# Patient Record
Sex: Female | Born: 1955
Health system: Southern US, Community
[De-identification: ages and names within clinical notes are randomized; demographics above are authoritative.]

## PROBLEM LIST (undated history)

## (undated) DIAGNOSIS — M1990 Unspecified osteoarthritis, unspecified site: Secondary | ICD-10-CM

## (undated) DIAGNOSIS — E785 Hyperlipidemia, unspecified: Secondary | ICD-10-CM

## (undated) DIAGNOSIS — T7840XA Allergy, unspecified, initial encounter: Secondary | ICD-10-CM

## (undated) DIAGNOSIS — I10 Essential (primary) hypertension: Secondary | ICD-10-CM

## (undated) HISTORY — PX: TONSILLECTOMY: SUR1361

## (undated) HISTORY — PX: SPINE SURGERY: SHX786

## (undated) HISTORY — DX: Unspecified osteoarthritis, unspecified site: M19.90

## (undated) HISTORY — DX: Hyperlipidemia, unspecified: E78.5

## (undated) HISTORY — DX: Essential (primary) hypertension: I10

## (undated) HISTORY — PX: HERNIA REPAIR: SHX51

## (undated) HISTORY — DX: Allergy, unspecified, initial encounter: T78.40XA

---
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
## 1981-11-19 HISTORY — PX: NASAL SEPTUM SURGERY: SHX37

## 2000-11-19 HISTORY — PX: NECK SURGERY: SHX720

## 2005-06-11 ENCOUNTER — Ambulatory Visit: Payer: Self-pay | Admitting: Unknown Physician Specialty

## 2006-04-02 ENCOUNTER — Ambulatory Visit: Payer: Self-pay | Admitting: Family Medicine

## 2006-07-01 ENCOUNTER — Ambulatory Visit: Payer: Self-pay | Admitting: Unknown Physician Specialty

## 2006-07-09 ENCOUNTER — Ambulatory Visit: Payer: Self-pay | Admitting: Unknown Physician Specialty

## 2007-03-06 ENCOUNTER — Ambulatory Visit: Payer: Self-pay | Admitting: Unknown Physician Specialty

## 2007-11-20 HISTORY — PX: COLONOSCOPY: SHX174

## 2008-06-17 ENCOUNTER — Ambulatory Visit: Payer: Self-pay | Admitting: Family Medicine

## 2008-07-13 ENCOUNTER — Ambulatory Visit: Payer: Self-pay | Admitting: General Surgery

## 2008-07-13 LAB — HM COLONOSCOPY: HM Colonoscopy: NORMAL

## 2009-04-22 ENCOUNTER — Ambulatory Visit: Payer: Self-pay | Admitting: Family Medicine

## 2009-10-04 ENCOUNTER — Ambulatory Visit: Payer: Self-pay | Admitting: Family Medicine

## 2010-12-07 ENCOUNTER — Ambulatory Visit: Payer: Self-pay | Admitting: Family Medicine

## 2011-04-25 ENCOUNTER — Encounter: Payer: Self-pay | Admitting: Rheumatology

## 2011-05-20 ENCOUNTER — Encounter: Payer: Self-pay | Admitting: Rheumatology

## 2011-06-20 ENCOUNTER — Encounter: Payer: Self-pay | Admitting: Rheumatology

## 2012-01-31 ENCOUNTER — Ambulatory Visit: Payer: Self-pay | Admitting: Family Medicine

## 2014-04-30 ENCOUNTER — Ambulatory Visit: Payer: Self-pay | Admitting: Family Medicine

## 2015-01-12 LAB — HM PAP SMEAR: HM Pap smear: NEGATIVE

## 2015-01-12 LAB — LIPID PANEL
Cholesterol: 221 mg/dL — AB (ref 0–200)
HDL: 56 mg/dL (ref 35–70)
LDL Cholesterol: 124 mg/dL
Triglycerides: 226 mg/dL — AB (ref 40–160)

## 2015-01-12 LAB — BASIC METABOLIC PANEL
BUN: 12 mg/dL (ref 4–21)
Creatinine: 0.8 mg/dL (ref 0.5–1.1)
Glucose: 112 mg/dL
Potassium: 4.3 mmol/L (ref 3.4–5.3)
Sodium: 140 mmol/L (ref 137–147)

## 2015-01-12 LAB — CBC AND DIFFERENTIAL
HCT: 42 % (ref 36–46)
Hemoglobin: 14.4 g/dL (ref 12.0–16.0)
Platelets: 243 10*3/uL (ref 150–399)
WBC: 6.5 10^3/mL

## 2015-01-12 LAB — TSH: TSH: 2.77 u[IU]/mL (ref 0.41–5.90)

## 2015-01-12 LAB — HEPATIC FUNCTION PANEL
ALT: 13 U/L (ref 7–35)
AST: 16 U/L (ref 13–35)

## 2015-05-17 ENCOUNTER — Other Ambulatory Visit: Payer: Self-pay | Admitting: Family Medicine

## 2015-05-17 DIAGNOSIS — E7800 Pure hypercholesterolemia, unspecified: Secondary | ICD-10-CM

## 2015-09-07 ENCOUNTER — Other Ambulatory Visit: Payer: Self-pay | Admitting: Family Medicine

## 2015-09-07 DIAGNOSIS — E7800 Pure hypercholesterolemia, unspecified: Secondary | ICD-10-CM

## 2015-09-10 ENCOUNTER — Ambulatory Visit (INDEPENDENT_AMBULATORY_CARE_PROVIDER_SITE_OTHER): Payer: BLUE CROSS/BLUE SHIELD

## 2015-09-10 DIAGNOSIS — Z23 Encounter for immunization: Secondary | ICD-10-CM | POA: Diagnosis not present

## 2015-10-01 ENCOUNTER — Other Ambulatory Visit: Payer: Self-pay | Admitting: Family Medicine

## 2015-10-01 DIAGNOSIS — J3089 Other allergic rhinitis: Secondary | ICD-10-CM

## 2015-10-03 DIAGNOSIS — J309 Allergic rhinitis, unspecified: Secondary | ICD-10-CM | POA: Insufficient documentation

## 2015-10-03 NOTE — Telephone Encounter (Signed)
Last ov 01/12/15

## 2015-12-19 ENCOUNTER — Other Ambulatory Visit: Payer: Self-pay | Admitting: Family Medicine

## 2015-12-19 DIAGNOSIS — E7800 Pure hypercholesterolemia, unspecified: Secondary | ICD-10-CM

## 2015-12-19 NOTE — Telephone Encounter (Signed)
Last OV 12/2014  Thanks,   -  

## 2016-01-20 DIAGNOSIS — D3500 Benign neoplasm of unspecified adrenal gland: Secondary | ICD-10-CM | POA: Insufficient documentation

## 2016-01-20 DIAGNOSIS — J309 Allergic rhinitis, unspecified: Secondary | ICD-10-CM | POA: Insufficient documentation

## 2016-01-20 DIAGNOSIS — Z87891 Personal history of nicotine dependence: Secondary | ICD-10-CM | POA: Insufficient documentation

## 2016-01-20 DIAGNOSIS — IMO0002 Reserved for concepts with insufficient information to code with codable children: Secondary | ICD-10-CM | POA: Insufficient documentation

## 2016-01-20 DIAGNOSIS — F17200 Nicotine dependence, unspecified, uncomplicated: Secondary | ICD-10-CM | POA: Insufficient documentation

## 2016-01-20 DIAGNOSIS — E038 Other specified hypothyroidism: Secondary | ICD-10-CM | POA: Insufficient documentation

## 2016-01-20 DIAGNOSIS — M1990 Unspecified osteoarthritis, unspecified site: Secondary | ICD-10-CM | POA: Insufficient documentation

## 2016-01-20 DIAGNOSIS — E669 Obesity, unspecified: Secondary | ICD-10-CM | POA: Insufficient documentation

## 2016-01-20 DIAGNOSIS — E039 Hypothyroidism, unspecified: Secondary | ICD-10-CM | POA: Insufficient documentation

## 2016-01-20 DIAGNOSIS — F4320 Adjustment disorder, unspecified: Secondary | ICD-10-CM | POA: Insufficient documentation

## 2016-01-23 ENCOUNTER — Encounter: Payer: Self-pay | Admitting: Family Medicine

## 2016-01-23 ENCOUNTER — Ambulatory Visit (INDEPENDENT_AMBULATORY_CARE_PROVIDER_SITE_OTHER): Payer: BLUE CROSS/BLUE SHIELD | Admitting: Family Medicine

## 2016-01-23 VITALS — BP 130/82 | HR 72 | Temp 97.6°F | Resp 16 | Ht 66.0 in | Wt 203.0 lb

## 2016-01-23 DIAGNOSIS — Z0000 Encounter for general adult medical examination without abnormal findings: Secondary | ICD-10-CM

## 2016-01-23 DIAGNOSIS — E038 Other specified hypothyroidism: Secondary | ICD-10-CM

## 2016-01-23 DIAGNOSIS — Z1239 Encounter for other screening for malignant neoplasm of breast: Secondary | ICD-10-CM

## 2016-01-23 DIAGNOSIS — E039 Hypothyroidism, unspecified: Secondary | ICD-10-CM

## 2016-01-23 DIAGNOSIS — Z1211 Encounter for screening for malignant neoplasm of colon: Secondary | ICD-10-CM

## 2016-01-23 DIAGNOSIS — E7800 Pure hypercholesterolemia, unspecified: Secondary | ICD-10-CM | POA: Diagnosis not present

## 2016-01-23 DIAGNOSIS — F32A Depression, unspecified: Secondary | ICD-10-CM | POA: Insufficient documentation

## 2016-01-23 DIAGNOSIS — F329 Major depressive disorder, single episode, unspecified: Secondary | ICD-10-CM

## 2016-01-23 DIAGNOSIS — R7309 Other abnormal glucose: Secondary | ICD-10-CM | POA: Insufficient documentation

## 2016-01-23 LAB — POCT URINALYSIS DIPSTICK
Bilirubin, UA: NEGATIVE
Blood, UA: NEGATIVE
Glucose, UA: NEGATIVE
Ketones, UA: NEGATIVE
Leukocytes, UA: NEGATIVE
Nitrite, UA: NEGATIVE
Protein, UA: NEGATIVE
Spec Grav, UA: 1.01
Urobilinogen, UA: 0.2
pH, UA: 6.5

## 2016-01-23 LAB — IFOBT (OCCULT BLOOD): IFOBT: NEGATIVE

## 2016-01-23 MED ORDER — CITALOPRAM HYDROBROMIDE 20 MG PO TABS: 20.0000 mg | ORAL_TABLET | Freq: Every day | ORAL | Status: DC

## 2016-01-23 NOTE — Progress Notes (Signed)
Patient ID: [name], female   DOB: 1955-12-13, 60 y.o.   MRN: [id number]       Patient: Sharon Parker, Female    DOB: 02/19/1956, 60 y.o.   MRN: [id number] Visit Date: 01/23/2016  Today's Provider: Margarita Rana, MD   Chief Complaint  Patient presents with  . Annual Exam   Subjective:    Annual physical exam Sharon Parker is a 60 y.o. female who presents today for health maintenance and complete physical. She feels fairly well. She reports exercising 3 times a week. She reports she is sleeping fairly well, 8 hours of sleep. 01/12/15 CPE 01/12/15 Pap-neg; HPV-neg 04/30/14 Mammogram BI-RADS 1 07/13/08 Colonoscopy-normal, Dr. Bary Castilla  Patient also with increased mood and depression issues. A lot of stress at home. Taking care of her mother and is loosing patience.   Has been on a medication previously with good response.     Lab Results  Component Value Date   WBC 6.5 01/12/2015   HGB 14.4 01/12/2015   HCT 42 01/12/2015   PLT 243 01/12/2015   CHOL 221* 01/12/2015   TRIG 226* 01/12/2015   HDL 56 01/12/2015   LDLCALC 124 01/12/2015   ALT 13 01/12/2015   AST 16 01/12/2015   NA 140 01/12/2015   K 4.3 01/12/2015   CREATININE 0.8 01/12/2015   BUN 12 01/12/2015   TSH 2.77 01/12/2015   -----------------------------------------------------------------   Review of Systems  Constitutional: Negative.   HENT: Negative.   Eyes: Negative.   Respiratory: Negative.   Cardiovascular: Negative.   Gastrointestinal: Negative.   Endocrine: Negative.   Genitourinary: Negative.   Musculoskeletal: Negative.   Skin: Negative.   Allergic/Immunologic: Positive for environmental allergies.  Neurological: Negative.   Hematological: Negative.   Psychiatric/Behavioral: Positive for sleep disturbance, decreased concentration and agitation. The patient is nervous/anxious.     Social History      She  reports that she quit smoking about 4 years ago. Her smoking use included  Cigarettes. She smoked 1.00 pack per day. She has never used smokeless tobacco. She reports that she does not drink alcohol or use illicit drugs.       Social History   Social History  . Marital Status: Married    Spouse Name: N/A  . Number of Children: N/A  . Years of Education: N/A   Social History Main Topics  . Smoking status: Former Smoker -- 1.00 packs/day    Types: Cigarettes    Quit date: 11/19/2011  . Smokeless tobacco: Never Used  . Alcohol Use: No  . Drug Use: No  . Sexual Activity: Not Asked   Other Topics Concern  . None   Social History Narrative    Past Medical History  Diagnosis Date  . Hyperlipidemia   . Allergy      Patient Active Problem List   Diagnosis Date Noted  . Abnormal blood sugar 01/23/2016  . Depression 01/23/2016  . Adaptation reaction 01/20/2016  . Adrenal adenoma 01/20/2016  . Allergic rhinitis 01/20/2016  . Adult BMI 30+ 01/20/2016  . Adiposity 01/20/2016  . Arthritis, degenerative 01/20/2016  . Subclinical hypothyroidism 01/20/2016  . Compulsive tobacco user syndrome 01/20/2016  . Rhinitis, allergic 10/03/2015  . Hypercholesteremia 05/17/2015    Past Surgical History  Procedure Laterality Date  . Neck surgery  2002    disc in neck replaced C5-C6  . Cesarean section  1994  . Nasal septum surgery  1983  . Tonsillectomy and adenoidectomy  1970  .  Hernia repair      Family History        Family Status  Relation Status Death Age  . Mother Alive   . Father Deceased     lung cancer Nicotine dependence  . Sister Alive   . Brother Alive   . Sister Alive   . Sister Alive   . Sister Alive   . Brother Alive         Her family history includes Cancer in her father; Diabetes in her sister; Healthy in her brother, brother, sister, sister, and sister; Heart disease in her mother; Obesity in her sister; Stroke in her mother.    No Known Allergies  Previous Medications   B COMPLEX-BIOTIN-FA (SUPER B-COMPLEX) CAPS    Take 1  capsule by mouth daily.   FLUTICASONE (FLONASE) 50 MCG/ACT NASAL SPRAY    instill 2 sprays into each nostril once daily   MULTIPLE VITAMIN PO    Take 1 capsule by mouth daily.   OMEGA-3 FATTY ACIDS (FISH OIL PEARLS) 300 MG CAPS    Take 2 capsules by mouth daily.   SIMVASTATIN (ZOCOR) 20 MG TABLET    Take 1 tablet (20 mg total) by mouth daily. Pt needs to schedule an office visit before anymore refills.   VITAMIN E PO    Take 1 capsule by mouth daily.    Patient Care Team: Margarita Rana, MD as PCP - General (Family Medicine)     Objective:   Vitals: BP 130/82 mmHg  Pulse 72  Temp(Src) 97.6 F (36.4 C) (Oral)  Resp 16  Ht 5\' 6"  (1.676 m)  Wt 203 lb (92.08 kg)  BMI 32.78 kg/m2   Physical Exam  Constitutional: She is oriented to person, place, and time. She appears well-developed and well-nourished.  HENT:  Head: Normocephalic and atraumatic.  Right Ear: Tympanic membrane, external ear and ear canal normal.  Left Ear: Tympanic membrane, external ear and ear canal normal.  Nose: Nose normal.  Mouth/Throat: Uvula is midline, oropharynx is clear and moist and mucous membranes are normal.  Eyes: Conjunctivae, EOM and lids are normal. Pupils are equal, round, and reactive to light.  Neck: Trachea normal and normal range of motion. Neck supple. Carotid bruit is not present. No thyroid mass and no thyromegaly present.  Cardiovascular: Normal rate, regular rhythm and normal heart sounds.   Pulmonary/Chest: Effort normal and breath sounds normal.  Abdominal: Soft. Normal appearance and bowel sounds are normal. There is no hepatosplenomegaly. There is no tenderness.  Genitourinary: Rectum normal. No breast swelling, tenderness or discharge.  Musculoskeletal: Normal range of motion.  Lymphadenopathy:    She has no cervical adenopathy.    She has no axillary adenopathy.  Neurological: She is alert and oriented to person, place, and time. She has normal strength. No cranial nerve deficit.    Skin: Skin is warm, dry and intact.  Psychiatric: She has a normal mood and affect. Her speech is normal and behavior is normal. Judgment and thought content normal. Cognition and memory are normal.     Depression Screen PHQ 2/9 Scores 01/23/2016  PHQ - 2 Score 3  PHQ- 9 Score 11      Assessment & Plan:     Routine Health Maintenance and Physical Exam  Exercise Activities and Dietary recommendations Goals    None      Immunization History  Administered Date(s) Administered  . Influenza,inj,Quad PF,36+ Mos 09/10/2015  . Td 08/24/2011  . Tdap 08/24/2011  . Zoster  08/24/2011        1. Annual physical exam Stable. Patient advised to continue eating healthy and exercise daily. - POCT urinalysis dipstick  2. Depression New problem. Worsening. Patient started on citalopram 20 mg as below. Patient to follow-up in 4 weeks. - citalopram (CELEXA) 20 MG tablet; Take 1 tablet (20 mg total) by mouth daily. Take half a tablet for 1 week then 1 tablet daily  Dispense: 30 tablet; Refill: 5  3. Colon cancer screening - IFOBT POC (occult bld, rslt in office)  4. Breast cancer screening - MM DIGITAL SCREENING BILATERAL; Future  5. Hypercholesteremia - CBC with Differential/Platelet - Lipid Panel With LDL/HDL Ratio  6. Subclinical hypothyroidism - Comprehensive metabolic panel - TSH  7. Abnormal blood sugar - Hemoglobin A1c   Patient seen and examined by Dr. Jerrell Belfast, and note scribed by Philbert Riser. Dimas, CMA.  I have reviewed the document for accuracy and completeness and I agree with above. Jerrell Belfast, MD      Margarita Rana, MD   --------------------------------------------------------------------

## 2016-01-24 ENCOUNTER — Telehealth: Payer: Self-pay

## 2016-01-24 LAB — CBC WITH DIFFERENTIAL/PLATELET
Basophils Absolute: 0.1 10*3/uL (ref 0.0–0.2)
Basos: 1 %
EOS (ABSOLUTE): 0.2 10*3/uL (ref 0.0–0.4)
Eos: 3 %
Hematocrit: 39.8 % (ref 34.0–46.6)
Hemoglobin: 13.6 g/dL (ref 11.1–15.9)
Immature Grans (Abs): 0 10*3/uL (ref 0.0–0.1)
Immature Granulocytes: 0 %
Lymphocytes Absolute: 3.2 10*3/uL — ABNORMAL HIGH (ref 0.7–3.1)
Lymphs: 41 %
MCH: 31.5 pg (ref 26.6–33.0)
MCHC: 34.2 g/dL (ref 31.5–35.7)
MCV: 92 fL (ref 79–97)
Monocytes Absolute: 0.6 10*3/uL (ref 0.1–0.9)
Monocytes: 8 %
Neutrophils Absolute: 3.8 10*3/uL (ref 1.4–7.0)
Neutrophils: 47 %
Platelets: 271 10*3/uL (ref 150–379)
RBC: 4.32 x10E6/uL (ref 3.77–5.28)
RDW: 13.1 % (ref 12.3–15.4)
WBC: 7.9 10*3/uL (ref 3.4–10.8)

## 2016-01-24 LAB — LIPID PANEL WITH LDL/HDL RATIO
Cholesterol, Total: 225 mg/dL — ABNORMAL HIGH (ref 100–199)
HDL: 54 mg/dL (ref >39)
LDL Calculated: 105 mg/dL — ABNORMAL HIGH (ref 0–99)
LDl/HDL Ratio: 1.9 ratio units (ref 0.0–3.2)
Triglycerides: 332 mg/dL — ABNORMAL HIGH (ref 0–149)
VLDL Cholesterol Cal: 66 mg/dL — ABNORMAL HIGH (ref 5–40)

## 2016-01-24 LAB — COMPREHENSIVE METABOLIC PANEL
ALT: 18 IU/L (ref 0–32)
AST: 18 IU/L (ref 0–40)
Albumin/Globulin Ratio: 1.9 (ref 1.1–2.5)
Albumin: 4.7 g/dL (ref 3.5–5.5)
Alkaline Phosphatase: 99 IU/L (ref 39–117)
BUN/Creatinine Ratio: 17 (ref 9–23)
BUN: 11 mg/dL (ref 6–24)
Bilirubin Total: 0.5 mg/dL (ref 0.0–1.2)
CO2: 24 mmol/L (ref 18–29)
Calcium: 9.4 mg/dL (ref 8.7–10.2)
Chloride: 100 mmol/L (ref 96–106)
Creatinine, Ser: 0.65 mg/dL (ref 0.57–1.00)
GFR calc Af Amer: 112 mL/min/{1.73_m2} (ref >59)
GFR calc non Af Amer: 97 mL/min/{1.73_m2} (ref >59)
Globulin, Total: 2.5 g/dL (ref 1.5–4.5)
Glucose: 95 mg/dL (ref 65–99)
Potassium: 4.5 mmol/L (ref 3.5–5.2)
Sodium: 142 mmol/L (ref 134–144)
Total Protein: 7.2 g/dL (ref 6.0–8.5)

## 2016-01-24 LAB — TSH: TSH: 3.18 u[IU]/mL (ref 0.450–4.500)

## 2016-01-24 LAB — HEMOGLOBIN A1C
Est. average glucose Bld gHb Est-mCnc: 117 mg/dL
Hgb A1c MFr Bld: 5.7 % — ABNORMAL HIGH (ref 4.8–5.6)

## 2016-01-24 NOTE — Telephone Encounter (Signed)
Pt advised as directed below.  She says she is going to work on diet and exercise before increasing her medicine.   Thanks,   -Mickel Baas

## 2016-01-24 NOTE — Telephone Encounter (Signed)
-----   Message from Margarita Rana, MD sent at 01/24/2016  7:16 AM EST ----- Labs stable Triglycerides are elevated. Recommend eat healthy and exercise and work on weight loss. Also,  Can increase statin if she would like. Thanks.

## 2016-02-21 ENCOUNTER — Ambulatory Visit (INDEPENDENT_AMBULATORY_CARE_PROVIDER_SITE_OTHER): Payer: BLUE CROSS/BLUE SHIELD | Admitting: Family Medicine

## 2016-02-21 ENCOUNTER — Encounter: Payer: Self-pay | Admitting: Family Medicine

## 2016-02-21 ENCOUNTER — Ambulatory Visit
Admission: RE | Admit: 2016-02-21 | Discharge: 2016-02-21 | Disposition: A | Discharge status: Home / Self Care | Payer: BLUE CROSS/BLUE SHIELD | Source: Ambulatory Visit | Attending: Family Medicine | Admitting: Family Medicine

## 2016-02-21 VITALS — BP 128/54 | HR 74 | Temp 98.4°F | Resp 16 | Wt 204.0 lb

## 2016-02-21 DIAGNOSIS — R937 Abnormal findings on diagnostic imaging of other parts of musculoskeletal system: Secondary | ICD-10-CM | POA: Diagnosis not present

## 2016-02-21 DIAGNOSIS — W19XXXA Unspecified fall, initial encounter: Secondary | ICD-10-CM | POA: Diagnosis not present

## 2016-02-21 DIAGNOSIS — M25532 Pain in left wrist: Secondary | ICD-10-CM | POA: Diagnosis present

## 2016-02-21 DIAGNOSIS — S6992XA Unspecified injury of left wrist, hand and finger(s), initial encounter: Secondary | ICD-10-CM

## 2016-02-21 MED ORDER — HYDROCODONE-ACETAMINOPHEN 5-325 MG PO TABS: ORAL_TABLET | ORAL | Status: DC

## 2016-02-21 NOTE — Progress Notes (Signed)
Subjective:     Patient ID: Sharon Parker, female   DOB: [birth year], 60 y.o.   MRN: [id number]  HPI  Chief Complaint  Patient presents with  . Fall    Patient comes in office today to address fall that happened on 02/17/16. Patient states that she had rolled out of bed when she fell and hit floor , landing on the left side of her body. Paitent reports when she was falling she tried placing her hand out to prevent it and in doing so has injured her left wrist. Patient reports decreased mobility of hand and wrist and swelling. Patient has been taking otc Aleve as needed.   States she has burning and numbness in her fingers as well. Has been elevating and using cold compresses.   Review of Systems     Objective:   Physical Exam  Constitutional: She appears well-developed and well-nourished. No distress (guarding her left hand).  Musculoskeletal:  Left hand warm with mild dorsal wrist/hand swelling. Most tender over the radial aspect of her wrist/thenar area       Assessment:    1. Left wrist injury, initial encounter: suspect fracture - DG Wrist Complete Left; Future - HYDROcodone-acetaminophen (NORCO/VICODIN) 5-325 MG tablet; One every 4-6 hours as needed for pain  Dispense: 28 tablet; Refill: 0    Plan:    Further f/u pending x-ray report.

## 2016-02-21 NOTE — Patient Instructions (Signed)
We will call you with the x-ray report 

## 2016-02-27 ENCOUNTER — Telehealth: Payer: Self-pay | Admitting: Family Medicine

## 2016-02-27 NOTE — Telephone Encounter (Signed)
Pt stated she was here for OV on 02/21/16(with Mikki Santee) and her left wrist is still hurting and wanted to move forward with the ortho referral. Please advise. Thanks TNP

## 2016-02-27 NOTE — Telephone Encounter (Signed)
Referral request

## 2016-02-28 ENCOUNTER — Other Ambulatory Visit: Payer: Self-pay | Admitting: Family Medicine

## 2016-02-28 ENCOUNTER — Telehealth: Payer: Self-pay | Admitting: Family Medicine

## 2016-02-28 DIAGNOSIS — S6992XA Unspecified injury of left wrist, hand and finger(s), initial encounter: Secondary | ICD-10-CM

## 2016-02-28 DIAGNOSIS — M25532 Pain in left wrist: Secondary | ICD-10-CM

## 2016-02-28 MED ORDER — HYDROCODONE-ACETAMINOPHEN 5-325 MG PO TABS: ORAL_TABLET | ORAL | Status: DC

## 2016-02-28 NOTE — Telephone Encounter (Signed)
done

## 2016-02-28 NOTE — Telephone Encounter (Signed)
Pt called, please call her back with the info regarding the referral.  She says her wrist and arm is hurting and would like to be seen asap.    Her call back is [phone].

## 2016-02-28 NOTE — Telephone Encounter (Signed)
Is there a way to expedite referral so patient can be seen sooner?KW

## 2016-02-28 NOTE — Telephone Encounter (Signed)
Pt is requesting refill on HYDROcodone-acetaminophen (NORCO/VICODIN) 5-325 MG tablet.until see can get in to see ortho on Friday 03/02/16.Please call when ready for pick up

## 2016-02-28 NOTE — Telephone Encounter (Signed)
See patient request regarding recent orthopedic referral

## 2016-02-28 NOTE — Telephone Encounter (Signed)
Referral in progress. 

## 2016-03-05 ENCOUNTER — Ambulatory Visit: Payer: BLUE CROSS/BLUE SHIELD | Admitting: Family Medicine

## 2016-03-06 ENCOUNTER — Ambulatory Visit
Admission: RE | Admit: 2016-03-06 | Discharge: 2016-03-06 | Disposition: A | Discharge status: Home / Self Care | Payer: BLUE CROSS/BLUE SHIELD | Source: Ambulatory Visit | Attending: Family Medicine | Admitting: Family Medicine

## 2016-03-06 DIAGNOSIS — Z1231 Encounter for screening mammogram for malignant neoplasm of breast: Secondary | ICD-10-CM | POA: Insufficient documentation

## 2016-03-06 DIAGNOSIS — Z1239 Encounter for other screening for malignant neoplasm of breast: Secondary | ICD-10-CM

## 2016-03-19 ENCOUNTER — Encounter: Payer: Self-pay | Admitting: Family Medicine

## 2016-03-19 ENCOUNTER — Ambulatory Visit (INDEPENDENT_AMBULATORY_CARE_PROVIDER_SITE_OTHER): Payer: BLUE CROSS/BLUE SHIELD | Admitting: Family Medicine

## 2016-03-19 VITALS — BP 138/60 | HR 76 | Temp 98.2°F | Resp 16 | Wt 204.0 lb

## 2016-03-19 DIAGNOSIS — F329 Major depressive disorder, single episode, unspecified: Secondary | ICD-10-CM

## 2016-03-19 DIAGNOSIS — F32A Depression, unspecified: Secondary | ICD-10-CM

## 2016-03-19 NOTE — Progress Notes (Signed)
Patient ID: Sharon Parker, female   DOB: 1956-01-22, 60 y.o.   MRN: [id number]         Patient: Sharon Parker Female    DOB: 08-24-1956   60 y.o.   MRN: [id number] Visit Date: 03/19/2016  Today's Provider: Margarita Rana, MD   Chief Complaint  Patient presents with  . Depression  . Anxiety   Subjective:    Depression        This is a chronic problem.  The problem has been gradually improving since onset.  Associated symptoms include decreased concentration and decreased interest.  Associated symptoms include no fatigue, no helplessness, no hopelessness, does not have insomnia, not irritable, no restlessness, no appetite change, no body aches, no myalgias, no headaches, no indigestion, not sad and no suicidal ideas.     The symptoms are aggravated by family issues.  Past treatments include SSRIs - Selective serotonin reuptake inhibitors.  Compliance with treatment is good.  Previous treatment provided moderate (Pt reports about a 50% improvement.) relief.  Past medical history includes anxiety.   Anxiety Presents for follow-up visit. The problem has been gradually improving. Symptoms include decreased concentration, depressed mood, excessive worry and nervous/anxious behavior. Patient reports no chest pain, confusion, dizziness, insomnia, irritability, malaise, nausea, obsessions, palpitations, panic, restlessness, shortness of breath or suicidal ideas. The quality of sleep is good.   Past treatments include SSRIs. The treatment provided moderate relief. Compliance with prior treatments has been good.       No Known Allergies Previous Medications   B COMPLEX-BIOTIN-FA (SUPER B-COMPLEX) CAPS    Take 1 capsule by mouth daily.   CITALOPRAM (CELEXA) 20 MG TABLET    Take 1 tablet (20 mg total) by mouth daily. Take half a tablet for 1 week then 1 tablet daily   FLUTICASONE (FLONASE) 50 MCG/ACT NASAL SPRAY    instill 2 sprays into each nostril once daily   MELOXICAM (MOBIC) 15 MG TABLET     Take 15 mg by mouth daily.   MULTIPLE VITAMIN PO    Take 1 capsule by mouth daily.   OMEGA-3 FATTY ACIDS (FISH OIL PEARLS) 300 MG CAPS    Take 2 capsules by mouth daily.   SIMVASTATIN (ZOCOR) 20 MG TABLET    Take 1 tablet (20 mg total) by mouth daily. Pt needs to schedule an office visit before anymore refills.   VITAMIN E PO    Take 1 capsule by mouth daily.    Review of Systems  Constitutional: Negative.  Negative for appetite change, irritability and fatigue.  Respiratory: Negative.  Negative for shortness of breath.   Cardiovascular: Negative.  Negative for chest pain and palpitations.  Gastrointestinal: Negative.  Negative for nausea.  Musculoskeletal: Positive for arthralgias. Negative for myalgias, back pain, joint swelling, gait problem, neck pain and neck stiffness.  Neurological: Negative for dizziness, light-headedness and headaches.  Psychiatric/Behavioral: Positive for depression, dysphoric mood and decreased concentration. Negative for suicidal ideas, hallucinations, behavioral problems, confusion, sleep disturbance, self-injury and agitation. The patient is nervous/anxious. The patient does not have insomnia and is not hyperactive.        Pt reports about a 50% improvement.     Social History  Substance Use Topics  . Smoking status: Former Smoker -- 1.00 packs/day    Types: Cigarettes    Quit date: 11/19/2011  . Smokeless tobacco: Never Used  . Alcohol Use: No   Objective:   BP 138/60 mmHg  Pulse 76  Temp(Src) 98.2 F (36.8  C) (Oral)  Resp 16  Wt 204 lb (92.534 kg)  Physical Exam  Constitutional: She is oriented to person, place, and time. She appears well-developed and well-nourished. She is not irritable.  Cardiovascular: Normal rate, regular rhythm and normal heart sounds.   Pulmonary/Chest: Effort normal and breath sounds normal.  Neurological: She is alert and oriented to person, place, and time.  Skin: Skin is warm and dry.  Psychiatric: She has a normal  mood and affect. Her behavior is normal. Judgment and thought content normal.        Assessment & Plan:     1. Depression Improved but not quite at goal. Will continue medication and add increased exercise to try to get to goal.  Patient instructed to call back if condition worsens or does not improve.     Patient was seen and examined by Jerrell Belfast, MD, and note scribed by Ashley Royalty, CMA.  I have reviewed the document for accuracy and completeness and I agree with above. - Jerrell Belfast, MD       Margarita Rana, MD  [facility]

## 2016-08-25 ENCOUNTER — Ambulatory Visit (INDEPENDENT_AMBULATORY_CARE_PROVIDER_SITE_OTHER): Payer: BLUE CROSS/BLUE SHIELD

## 2016-08-25 DIAGNOSIS — Z23 Encounter for immunization: Secondary | ICD-10-CM | POA: Diagnosis not present

## 2016-08-30 ENCOUNTER — Ambulatory Visit: Payer: BLUE CROSS/BLUE SHIELD

## 2016-09-01 ENCOUNTER — Other Ambulatory Visit: Payer: Self-pay | Admitting: Family Medicine

## 2016-09-01 DIAGNOSIS — F32A Depression, unspecified: Secondary | ICD-10-CM

## 2016-09-01 DIAGNOSIS — F329 Major depressive disorder, single episode, unspecified: Secondary | ICD-10-CM

## 2016-09-03 NOTE — Telephone Encounter (Signed)
Pt's LOV was with Dr. Venia Minks on 03/19/2016. Does not have FU scheduled. Renaldo Fiddler, CMA

## 2016-11-15 ENCOUNTER — Other Ambulatory Visit: Payer: Self-pay | Admitting: Family Medicine

## 2016-11-15 DIAGNOSIS — J3089 Other allergic rhinitis: Secondary | ICD-10-CM

## 2016-11-15 NOTE — Telephone Encounter (Signed)
Last ov 03/28/16

## 2016-11-19 ENCOUNTER — Other Ambulatory Visit: Payer: Self-pay | Admitting: Family Medicine

## 2016-11-19 DIAGNOSIS — E7800 Pure hypercholesterolemia, unspecified: Secondary | ICD-10-CM

## 2016-11-20 NOTE — Telephone Encounter (Signed)
[place] wanted to know if she can have a refill for this month. Patient did schedule appt for 12/10/16. Please advise. Thank you. sd

## 2016-11-21 MED ORDER — SIMVASTATIN 20 MG PO TABS: 20.0000 mg | ORAL_TABLET | Freq: Every day | ORAL | 0 refills | Status: DC

## 2016-12-10 ENCOUNTER — Ambulatory Visit (INDEPENDENT_AMBULATORY_CARE_PROVIDER_SITE_OTHER): Payer: BLUE CROSS/BLUE SHIELD | Admitting: Physician Assistant

## 2016-12-10 ENCOUNTER — Encounter: Payer: Self-pay | Admitting: Physician Assistant

## 2016-12-10 VITALS — BP 144/66 | HR 68 | Temp 98.0°F | Resp 16 | Ht 66.0 in | Wt 213.0 lb

## 2016-12-10 DIAGNOSIS — R7309 Other abnormal glucose: Secondary | ICD-10-CM | POA: Diagnosis not present

## 2016-12-10 DIAGNOSIS — E7800 Pure hypercholesterolemia, unspecified: Secondary | ICD-10-CM

## 2016-12-10 LAB — POCT GLYCOSYLATED HEMOGLOBIN (HGB A1C)
Est. average glucose Bld gHb Est-mCnc: 120
Hemoglobin A1C: 5.8

## 2016-12-10 MED ORDER — SIMVASTATIN 20 MG PO TABS: 20.0000 mg | ORAL_TABLET | Freq: Every day | ORAL | 5 refills | Status: DC

## 2016-12-10 NOTE — Patient Instructions (Signed)
Exercising to Lose Weight Introduction Exercising can help you to lose weight. In order to lose weight through exercise, you need to do vigorous-intensity exercise. You can tell that you are exercising with vigorous intensity if you are breathing very hard and fast and cannot hold a conversation while exercising. Moderate-intensity exercise helps to maintain your current weight. You can tell that you are exercising at a moderate level if you have a higher heart rate and faster breathing, but you are still able to hold a conversation. How often should I exercise? Choose an activity that you enjoy and set realistic goals. Your health care provider can help you to make an activity plan that works for you. Exercise regularly as directed by your health care provider. This may include:  Doing resistance training twice each week, such as:  Push-ups.  Sit-ups.  Lifting weights.  Using resistance bands.  Doing a given intensity of exercise for a given amount of time. Choose from these options:  150 minutes of moderate-intensity exercise every week.  75 minutes of vigorous-intensity exercise every week.  A mix of moderate-intensity and vigorous-intensity exercise every week. Children, pregnant women, people who are out of shape, people who are overweight, and older adults may need to consult a health care provider for individual recommendations. If you have any sort of medical condition, be sure to consult your health care provider before starting a new exercise program. What are some activities that can help me to lose weight?  Walking at a rate of at least 4.5 miles an hour.  Jogging or running at a rate of 5 miles per hour.  Biking at a rate of at least 10 miles per hour.  Lap swimming.  Roller-skating or in-line skating.  Cross-country skiing.  Vigorous competitive sports, such as football, basketball, and soccer.  Jumping rope.  Aerobic dancing. How can I be more active in my  day-to-day activities?  Use the stairs instead of the elevator.  Take a walk during your lunch break.  If you drive, park your car farther away from work or school.  If you take public transportation, get off one stop early and walk the rest of the way.  Make all of your phone calls while standing up and walking around.  Get up, stretch, and walk around every 30 minutes throughout the day. What guidelines should I follow while exercising?  Do not exercise so much that you hurt yourself, feel dizzy, or get very short of breath.  Consult your health care provider prior to starting a new exercise program.  Wear comfortable clothes and shoes with good support.  Drink plenty of water while you exercise to prevent dehydration or heat stroke. Body water is lost during exercise and must be replaced.  Work out until you breathe faster and your heart beats faster. This information is not intended to replace advice given to you by your health care provider. Make sure you discuss any questions you have with your health care provider. Document Released: 12/08/2010 Document Revised: 04/12/2016 Document Reviewed: 04/08/2014  2017 Elsevier  

## 2016-12-10 NOTE — Progress Notes (Signed)
Patient: Sharon Parker Female    DOB: 1956-07-18   61 y.o.   MRN: [id number] Visit Date: 12/10/2016  Today's Provider: Mar Daring, PA-C   Chief Complaint  Patient presents with  . Hyperglycemia  . Hypothyroidism  . Depression   Subjective:    HPI  Prediabetes, Follow-up:   Lab Results  Component Value Date   HGBA1C 5.8 12/10/2016   HGBA1C 5.7 (H) 01/23/2016   GLUCOSE 95 01/23/2016    Last seen for for this10 months ago.  Management since that visit includes no changes. Current symptoms include none and have been stable.  Weight trend: stable Prior visit with dietician: no Current diet: in general, a "healthy" diet   Current exercise: cardiovascular workout on exercise equipment  Pertinent Labs:    Component Value Date/Time   CHOL 225 (H) 01/23/2016 1503   TRIG 332 (H) 01/23/2016 1503   CREATININE 0.65 01/23/2016 1503    Wt Readings from Last 3 Encounters:  12/10/16 213 lb (96.6 kg)  03/19/16 204 lb (92.5 kg)  02/21/16 204 lb (92.5 kg)    Depression, Follow-up  She  was last seen for this 10 months ago. Changes made at last visit include no changes.   She reports excellent compliance with treatment. She is not having side effects.   She reports excellent tolerance of treatment. Current symptoms include: none She feels she is Improved since last visit.  ------------------------------------------------------------------------    Lipid/Cholesterol, Follow-up:   Last seen for this10 months ago.  Management changes since that visit include check labs. . Last Lipid Panel:    Component Value Date/Time   CHOL 225 (H) 01/23/2016 1503   TRIG 332 (H) 01/23/2016 1503   HDL 54 01/23/2016 1503   LDLCALC 105 (H) 01/23/2016 1503    Risk factors for vascular disease include hypercholesterolemia  She reports excellent compliance with treatment. She is not having side effects.  Current symptoms include none and have been  stable. Weight trend: stable Prior visit with dietician: no Current diet: in general, a "healthy" diet   Current exercise: cardiovascular workout on exercise equipment  Wt Readings from Last 3 Encounters:  12/10/16 213 lb (96.6 kg)  03/19/16 204 lb (92.5 kg)  02/21/16 204 lb (92.5 kg)    -------------------------------------------------------------------     No Known Allergies   Current Outpatient Prescriptions:  .  citalopram (CELEXA) 20 MG tablet, Take 1 tablet (20 mg total) by mouth daily., Disp: 30 tablet, Rfl: 5 .  fluticasone (FLONASE) 50 MCG/ACT nasal spray, instill 2 sprays into each nostril once daily, Disp: 16 g, Rfl: 10 .  simvastatin (ZOCOR) 20 MG tablet, Take 1 tablet (20 mg total) by mouth daily. Pt needs to schedule an office visit before anymore refills., Disp: 30 tablet, Rfl: 0  Review of Systems  Constitutional: Negative.   Respiratory: Negative.   Cardiovascular: Negative.   Gastrointestinal: Negative.   Neurological: Negative.   Psychiatric/Behavioral: Negative.     Social History  Substance Use Topics  . Smoking status: Former Smoker    Packs/day: 1.00    Types: Cigarettes    Quit date: 11/19/2011  . Smokeless tobacco: Never Used  . Alcohol use No   Objective:   BP (!) 144/66 (BP Location: Left Arm, Patient Position: Sitting, Cuff Size: Large)   Pulse 68   Temp 98 F (36.7 C) (Oral)   Resp 16   Ht 5\' 6"  (1.676 m)   Wt 213 lb (96.6 kg)  BMI 34.38 kg/m   Physical Exam  Constitutional: She appears well-developed and well-nourished. No distress.  Neck: Normal range of motion. Neck supple.  Cardiovascular: Normal rate, regular rhythm and normal heart sounds.  Exam reveals no gallop and no friction rub.   No murmur heard. Pulmonary/Chest: Effort normal and breath sounds normal. No respiratory distress. She has no wheezes. She has no rales.  Skin: She is not diaphoretic.  Psychiatric: She has a normal mood and affect. Her behavior is normal.  Judgment and thought content normal.  Vitals reviewed.      Assessment & Plan:     1. Abnormal blood sugar A1c fairly stable at 5.7. Patient is due for CPE in March. I will see her back then. - POCT glycosylated hemoglobin (Hb A1C)  2. Hypercholesteremia Stable. Diagnosis pulled for medication refill. Continue current medical treatment plan. - simvastatin (ZOCOR) 20 MG tablet; Take 1 tablet (20 mg total) by mouth daily.  Dispense: 30 tablet; Refill: 5  The entirety of the information documented in the History of Present Illness, Review of Systems and Physical Exam were personally obtained by me. Portions of this information were initially documented by Wyn Forster, CMA and reviewed by me for thoroughness and accuracy.      Mar Daring, PA-C  [facility]

## 2017-02-11 ENCOUNTER — Encounter: Payer: Self-pay | Admitting: Physician Assistant

## 2017-02-11 ENCOUNTER — Ambulatory Visit (INDEPENDENT_AMBULATORY_CARE_PROVIDER_SITE_OTHER): Payer: BLUE CROSS/BLUE SHIELD | Admitting: Physician Assistant

## 2017-02-11 VITALS — BP 128/64 | HR 64 | Temp 97.9°F | Resp 14 | Ht 65.25 in | Wt 205.0 lb

## 2017-02-11 DIAGNOSIS — R7309 Other abnormal glucose: Secondary | ICD-10-CM | POA: Diagnosis not present

## 2017-02-11 DIAGNOSIS — Z1239 Encounter for other screening for malignant neoplasm of breast: Secondary | ICD-10-CM

## 2017-02-11 DIAGNOSIS — Z1231 Encounter for screening mammogram for malignant neoplasm of breast: Secondary | ICD-10-CM

## 2017-02-11 DIAGNOSIS — E039 Hypothyroidism, unspecified: Secondary | ICD-10-CM

## 2017-02-11 DIAGNOSIS — Z0000 Encounter for general adult medical examination without abnormal findings: Secondary | ICD-10-CM | POA: Diagnosis not present

## 2017-02-11 DIAGNOSIS — E038 Other specified hypothyroidism: Secondary | ICD-10-CM

## 2017-02-11 DIAGNOSIS — Z114 Encounter for screening for human immunodeficiency virus [HIV]: Secondary | ICD-10-CM | POA: Diagnosis not present

## 2017-02-11 DIAGNOSIS — F3342 Major depressive disorder, recurrent, in full remission: Secondary | ICD-10-CM | POA: Diagnosis not present

## 2017-02-11 DIAGNOSIS — E7800 Pure hypercholesterolemia, unspecified: Secondary | ICD-10-CM

## 2017-02-11 MED ORDER — CITALOPRAM HYDROBROMIDE 20 MG PO TABS: 20.0000 mg | ORAL_TABLET | Freq: Every day | ORAL | 5 refills | Status: DC

## 2017-02-11 NOTE — Progress Notes (Signed)
Patient: Sharon Parker, Female    DOB: 10-08-1956, 61 y.o.   MRN: [id number] Visit Date: 02/11/2017  Today's Provider: Mar Daring, PA-C   Chief Complaint  Patient presents with  . Annual Exam   Subjective:    Annual physical exam Sharon Parker is a 61 y.o. female who presents today for health maintenance and complete physical. She feels well. She reports exercising 4 to 5 days a week goes to the gym for an hour. She reports she is sleeping well.  Immunization History  Administered Date(s) Administered  . Influenza,inj,Quad PF,36+ Mos 09/10/2015, 08/25/2016  . Td 08/24/2011  . Tdap 08/24/2011  . Zoster 08/24/2011   Last:   Mammogram 03/06/16 normal  Colonoscopy 07/13/08 normal  Pap with HPV 01/12/15 negative-per patient has history of abnormal pap smears x 2 in the past.   Last CPE 01/23/16  Lab Results  Component Value Date   HGBA1C 5.8 12/10/2016      Review of Systems  Constitutional: Negative.   HENT: Positive for sneezing.   Eyes: Negative.   Respiratory: Negative.   Cardiovascular: Negative.   Gastrointestinal: Negative.   Endocrine: Negative.   Genitourinary: Negative.   Musculoskeletal: Negative.   Skin: Negative.   Allergic/Immunologic: Negative.   Neurological: Negative.   Hematological: Negative.   Psychiatric/Behavioral: Negative.     Social History      She  reports that she quit smoking about 5 years ago. Her smoking use included Cigarettes. She has a 25.00 pack-year smoking history. She has never used smokeless tobacco. She reports that she does not drink alcohol or use drugs.       Social History   Social History  . Marital status: Married    Spouse name: N/A  . Number of children: N/A  . Years of education: N/A   Social History Main Topics  . Smoking status: Former Smoker    Packs/day: 1.00    Years: 25.00    Types: Cigarettes    Quit date: 11/19/2011  . Smokeless tobacco: Never Used  . Alcohol use No  . Drug  use: No  . Sexual activity: No   Other Topics Concern  . None   Social History Narrative  . None    Past Medical History:  Diagnosis Date  . Allergy   . Hyperlipidemia      Patient Active Problem List   Diagnosis Date Noted  . Abnormal blood sugar 01/23/2016  . Depression 01/23/2016  . Adaptation reaction 01/20/2016  . Adrenal adenoma 01/20/2016  . Allergic rhinitis 01/20/2016  . Adult BMI 30+ 01/20/2016  . Adiposity 01/20/2016  . Arthritis, degenerative 01/20/2016  . Subclinical hypothyroidism 01/20/2016  . Compulsive tobacco user syndrome 01/20/2016  . Rhinitis, allergic 10/03/2015  . Hypercholesteremia 05/17/2015    Past Surgical History:  Procedure Laterality Date  . CESAREAN SECTION  1994  . HERNIA REPAIR    . NASAL SEPTUM SURGERY  1983  . NECK SURGERY  2002   disc in neck replaced C5-C6  . TONSILLECTOMY AND ADENOIDECTOMY  1970    Family History        Family Status  Relation Status  . Mother Alive  . Father Deceased   lung cancer Nicotine dependence  . Sister Alive  . Brother Alive  . Sister Alive  . Sister Alive  . Sister Alive  . Brother Alive        Her family history includes Cancer in her father; Diabetes  in her sister; Healthy in her brother, brother, sister, sister, and sister; Heart disease in her mother; Obesity in her sister; Stroke in her mother.     No Known Allergies   Current Outpatient Prescriptions:  .  citalopram (CELEXA) 20 MG tablet, Take 1 tablet (20 mg total) by mouth daily., Disp: 30 tablet, Rfl: 5 .  fluticasone (FLONASE) 50 MCG/ACT nasal spray, instill 2 sprays into each nostril once daily, Disp: 16 g, Rfl: 10 .  simvastatin (ZOCOR) 20 MG tablet, Take 1 tablet (20 mg total) by mouth daily., Disp: 30 tablet, Rfl: 5   Patient Care Team: Mar Daring, PA-C as PCP - General (Family Medicine)      Objective:   Vitals: BP 128/64   Pulse 64   Temp 97.9 F (36.6 C)   Resp 14   Ht 5' 5.25" (1.657 m)   Wt 205 lb  (93 kg)   BMI 33.85 kg/m    Vitals:   02/11/17 0921  BP: 128/64  Pulse: 64  Resp: 14  Temp: 97.9 F (36.6 C)  Weight: 205 lb (93 kg)  Height: 5' 5.25" (1.657 m)     Physical Exam  Constitutional: She is oriented to person, place, and time. She appears well-developed and well-nourished. No distress.  HENT:  Head: Normocephalic and atraumatic.  Right Ear: Hearing, tympanic membrane, external ear and ear canal normal.  Left Ear: Hearing, tympanic membrane, external ear and ear canal normal.  Nose: Nose normal.  Mouth/Throat: Uvula is midline, oropharynx is clear and moist and mucous membranes are normal. No oropharyngeal exudate.  Eyes: Conjunctivae and EOM are normal. Pupils are equal, round, and reactive to light. Right eye exhibits no discharge. Left eye exhibits no discharge. No scleral icterus.  Neck: Normal range of motion. Neck supple. No JVD present. Carotid bruit is not present. No tracheal deviation present. No thyromegaly present.  Cardiovascular: Normal rate, regular rhythm, normal heart sounds and intact distal pulses.  Exam reveals no gallop and no friction rub.   No murmur heard. Pulmonary/Chest: Effort normal and breath sounds normal. No respiratory distress. She has no wheezes. She has no rales. She exhibits no tenderness. Right breast exhibits no inverted nipple, no mass, no nipple discharge, no skin change and no tenderness. Left breast exhibits no inverted nipple, no mass, no nipple discharge, no skin change and no tenderness. Breasts are symmetrical.  Abdominal: Soft. Bowel sounds are normal. She exhibits no distension and no mass. There is no tenderness. There is no rebound and no guarding.  Musculoskeletal: Normal range of motion. She exhibits no edema or tenderness.  Lymphadenopathy:    She has no cervical adenopathy.  Neurological: She is alert and oriented to person, place, and time.  Skin: Skin is warm and dry. No rash noted. She is not diaphoretic.    Psychiatric: She has a normal mood and affect. Her behavior is normal. Judgment and thought content normal.  Vitals reviewed.    Depression Screen PHQ 2/9 Scores 02/11/2017 01/23/2016  PHQ - 2 Score 0 3  PHQ- 9 Score 0 11      Assessment & Plan:   1. Annual physical exam Normal physical exam today. Will check labs as below and f/u pending lab results. If labs are stable and WNL she will not need to have these rechecked for one year at her next annual physical exam. She is to call the office in the meantime if she has any acute issue, questions or concerns. - CBC  w/Diff/Platelet - Comprehensive Metabolic Panel (CMET) - TSH - Lipid Profile - HgB A1c  2. Breast cancer screening Breast exam today was normal. There is no family history of breast cancer. She does perform regular self breast exams. Mammogram was ordered as below. Information for [facility] clinic was given to patient so she may schedule her mammogram at her convenience. - MM Digital Screening; Future  3. Subclinical hypothyroidism No symptoms. Will check labs as below and f/u pending results. - TSH  4. Hypercholesteremia Controlled on simvastatin 20mg . Will check labs as below and f/u pending results. - CBC w/Diff/Platelet - Comprehensive Metabolic Panel (CMET) - Lipid Profile - HgB A1c  5. Abnormal blood sugar Controlled. Will check labs as below and f/u pending results. - CBC w/Diff/Platelet - Comprehensive Metabolic Panel (CMET) - Lipid Profile - HgB A1c  6. Screening for HIV without presence of risk factors - HIV antibody (with reflex)  7. Recurrent major depressive disorder, in full remission ([place]) Stable. Diagnosis pulled for medication refill. Continue current medical treatment plan. - citalopram (CELEXA) 20 MG tablet; Take 1 tablet (20 mg total) by mouth daily.  Dispense: 30 tablet; Refill: [place], PA-C  [facility]

## 2017-02-11 NOTE — Patient Instructions (Signed)
Health Maintenance for Postmenopausal Women Menopause is a normal process in which your reproductive ability comes to an end. This process happens gradually over a span of months to years, usually between the ages of 33 and 38. Menopause is complete when you have missed 12 consecutive menstrual periods. It is important to talk with your health care provider about some of the most common conditions that affect postmenopausal women, such as heart disease, cancer, and bone loss (osteoporosis). Adopting a healthy lifestyle and getting preventive care can help to promote your health and wellness. Those actions can also lower your chances of developing some of these common conditions. What should I know about menopause? During menopause, you may experience a number of symptoms, such as:  Moderate-to-severe hot flashes.  Night sweats.  Decrease in sex drive.  Mood swings.  Headaches.  Tiredness.  Irritability.  Memory problems.  Insomnia. Choosing to treat or not to treat menopausal changes is an individual decision that you make with your health care provider. What should I know about hormone replacement therapy and supplements? Hormone therapy products are effective for treating symptoms that are associated with menopause, such as hot flashes and night sweats. Hormone replacement carries certain risks, especially as you become older. If you are thinking about using estrogen or estrogen with progestin treatments, discuss the benefits and risks with your health care provider. What should I know about heart disease and stroke? Heart disease, heart attack, and stroke become more likely as you age. This may be due, in part, to the hormonal changes that your body experiences during menopause. These can affect how your body processes dietary fats, triglycerides, and cholesterol. Heart attack and stroke are both medical emergencies. There are many things that you can do to help prevent heart disease  and stroke:  Have your blood pressure checked at least every 1-2 years. High blood pressure causes heart disease and increases the risk of stroke.  If you are 48-61 years old, ask your health care provider if you should take aspirin to prevent a heart attack or a stroke.  Do not use any tobacco products, including cigarettes, chewing tobacco, or electronic cigarettes. If you need help quitting, ask your health care provider.  It is important to eat a healthy diet and maintain a healthy weight.  Be sure to include plenty of vegetables, fruits, low-fat dairy products, and lean protein.  Avoid eating foods that are high in solid fats, added sugars, or salt (sodium).  Get regular exercise. This is one of the most important things that you can do for your health.  Try to exercise for at least 150 minutes each week. The type of exercise that you do should increase your heart rate and make you sweat. This is known as moderate-intensity exercise.  Try to do strengthening exercises at least twice each week. Do these in addition to the moderate-intensity exercise.  Know your numbers.Ask your health care provider to check your cholesterol and your blood glucose. Continue to have your blood tested as directed by your health care provider. What should I know about cancer screening? There are several types of cancer. Take the following steps to reduce your risk and to catch any cancer development as early as possible. Breast Cancer  Practice breast self-awareness.  This means understanding how your breasts normally appear and feel.  It also means doing regular breast self-exams. Let your health care provider know about any changes, no matter how small.  If you are 40 or older,  have a clinician do a breast exam (clinical breast exam or CBE) every year. Depending on your age, family history, and medical history, it may be recommended that you also have a yearly breast X-ray (mammogram).  If you  have a family history of breast cancer, talk with your health care provider about genetic screening.  If you are at high risk for breast cancer, talk with your health care provider about having an MRI and a mammogram every year.  Breast cancer (BRCA) gene test is recommended for women who have family members with BRCA-related cancers. Results of the assessment will determine the need for genetic counseling and BRCA1 and for BRCA2 testing. BRCA-related cancers include these types:  Breast. This occurs in males or females.  Ovarian.  Tubal. This may also be called fallopian tube cancer.  Cancer of the abdominal or pelvic lining (peritoneal cancer).  Prostate.  Pancreatic. Cervical, Uterine, and Ovarian Cancer  Your health care provider may recommend that you be screened regularly for cancer of the pelvic organs. These include your ovaries, uterus, and vagina. This screening involves a pelvic exam, which includes checking for microscopic changes to the surface of your cervix (Pap test).  For women ages 21-65, health care providers may recommend a pelvic exam and a Pap test every three years. For women ages 23-65, they may recommend the Pap test and pelvic exam, combined with testing for human papilloma virus (HPV), every five years. Some types of HPV increase your risk of cervical cancer. Testing for HPV may also be done on women of any age who have unclear Pap test results.  Other health care providers may not recommend any screening for nonpregnant women who are considered low risk for pelvic cancer and have no symptoms. Ask your health care provider if a screening pelvic exam is right for you.  If you have had past treatment for cervical cancer or a condition that could lead to cancer, you need Pap tests and screening for cancer for at least 20 years after your treatment. If Pap tests have been discontinued for you, your risk factors (such as having a new sexual partner) need to be reassessed  to determine if you should start having screenings again. Some women have medical problems that increase the chance of getting cervical cancer. In these cases, your health care provider may recommend that you have screening and Pap tests more often.  If you have a family history of uterine cancer or ovarian cancer, talk with your health care provider about genetic screening.  If you have vaginal bleeding after reaching menopause, tell your health care provider.  There are currently no reliable tests available to screen for ovarian cancer. Lung Cancer  Lung cancer screening is recommended for adults 99-83 years old who are at high risk for lung cancer because of a history of smoking. A yearly low-dose CT scan of the lungs is recommended if you:  Currently smoke.  Have a history of at least 30 pack-years of smoking and you currently smoke or have quit within the past 15 years. A pack-year is smoking an average of one pack of cigarettes per day for one year. Yearly screening should:  Continue until it has been 15 years since you quit.  Stop if you develop a health problem that would prevent you from having lung cancer treatment. Colorectal Cancer  This type of cancer can be detected and can often be prevented.  Routine colorectal cancer screening usually begins at age 72 and continues  through age 75.  If you have risk factors for colon cancer, your health care provider may recommend that you be screened at an earlier age.  If you have a family history of colorectal cancer, talk with your health care provider about genetic screening.  Your health care provider may also recommend using home test kits to check for hidden blood in your stool.  A small camera at the end of a tube can be used to examine your colon directly (sigmoidoscopy or colonoscopy). This is done to check for the earliest forms of colorectal cancer.  Direct examination of the colon should be repeated every 5-10 years until  age 75. However, if early forms of precancerous polyps or small growths are found or if you have a family history or genetic risk for colorectal cancer, you may need to be screened more often. Skin Cancer  Check your skin from head to toe regularly.  Monitor any moles. Be sure to tell your health care provider:  About any new moles or changes in moles, especially if there is a change in a mole's shape or color.  If you have a mole that is larger than the size of a pencil eraser.  If any of your family members has a history of skin cancer, especially at a young age, talk with your health care provider about genetic screening.  Always use sunscreen. Apply sunscreen liberally and repeatedly throughout the day.  Whenever you are outside, protect yourself by wearing long sleeves, pants, a wide-brimmed hat, and sunglasses. What should I know about osteoporosis? Osteoporosis is a condition in which bone destruction happens more quickly than new bone creation. After menopause, you may be at an increased risk for osteoporosis. To help prevent osteoporosis or the bone fractures that can happen because of osteoporosis, the following is recommended:  If you are 19-50 years old, get at least 1,000 mg of calcium and at least 600 mg of vitamin D per day.  If you are older than age 50 but younger than age 70, get at least 1,200 mg of calcium and at least 600 mg of vitamin D per day.  If you are older than age 70, get at least 1,200 mg of calcium and at least 800 mg of vitamin D per day. Smoking and excessive alcohol intake increase the risk of osteoporosis. Eat foods that are rich in calcium and vitamin D, and do weight-bearing exercises several times each week as directed by your health care provider. What should I know about how menopause affects my mental health? Depression may occur at any age, but it is more common as you become older. Common symptoms of depression include:  Low or sad  mood.  Changes in sleep patterns.  Changes in appetite or eating patterns.  Feeling an overall lack of motivation or enjoyment of activities that you previously enjoyed.  Frequent crying spells. Talk with your health care provider if you think that you are experiencing depression. What should I know about immunizations? It is important that you get and maintain your immunizations. These include:  Tetanus, diphtheria, and pertussis (Tdap) booster vaccine.  Influenza every year before the flu season begins.  Pneumonia vaccine.  Shingles vaccine. Your health care provider may also recommend other immunizations. This information is not intended to replace advice given to you by your health care provider. Make sure you discuss any questions you have with your health care provider. Document Released: 12/28/2005 Document Revised: 05/25/2016 Document Reviewed: 08/09/2015 Elsevier Interactive Patient   Education  2017 Elsevier Inc[employer]  

## 2017-02-12 LAB — COMPREHENSIVE METABOLIC PANEL
ALT: 14 IU/L (ref 0–32)
AST: 17 IU/L (ref 0–40)
Albumin/Globulin Ratio: 1.9 (ref 1.2–2.2)
Albumin: 4.6 g/dL (ref 3.6–4.8)
Alkaline Phosphatase: 92 IU/L (ref 39–117)
BUN/Creatinine Ratio: 18 (ref 12–28)
BUN: 14 mg/dL (ref 8–27)
Bilirubin Total: 0.4 mg/dL (ref 0.0–1.2)
CO2: 25 mmol/L (ref 18–29)
Calcium: 9.5 mg/dL (ref 8.7–10.3)
Chloride: 100 mmol/L (ref 96–106)
Creatinine, Ser: 0.8 mg/dL (ref 0.57–1.00)
GFR calc Af Amer: 93 mL/min/{1.73_m2} (ref >59)
GFR calc non Af Amer: 80 mL/min/{1.73_m2} (ref >59)
Globulin, Total: 2.4 g/dL (ref 1.5–4.5)
Glucose: 103 mg/dL — ABNORMAL HIGH (ref 65–99)
Potassium: 4.2 mmol/L (ref 3.5–5.2)
Sodium: 140 mmol/L (ref 134–144)
Total Protein: 7 g/dL (ref 6.0–8.5)

## 2017-02-12 LAB — LIPID PANEL
Chol/HDL Ratio: 3.9 ratio units (ref 0.0–4.4)
Cholesterol, Total: 207 mg/dL — ABNORMAL HIGH (ref 100–199)
HDL: 53 mg/dL (ref >39)
LDL Calculated: 105 mg/dL — ABNORMAL HIGH (ref 0–99)
Triglycerides: 245 mg/dL — ABNORMAL HIGH (ref 0–149)
VLDL Cholesterol Cal: 49 mg/dL — ABNORMAL HIGH (ref 5–40)

## 2017-02-12 LAB — CBC WITH DIFFERENTIAL/PLATELET
Basophils Absolute: 0.1 10*3/uL (ref 0.0–0.2)
Basos: 2 %
EOS (ABSOLUTE): 0.2 10*3/uL (ref 0.0–0.4)
Eos: 4 %
Hematocrit: 39.2 % (ref 34.0–46.6)
Hemoglobin: 13.1 g/dL (ref 11.1–15.9)
Immature Grans (Abs): 0 10*3/uL (ref 0.0–0.1)
Immature Granulocytes: 0 %
Lymphocytes Absolute: 2.6 10*3/uL (ref 0.7–3.1)
Lymphs: 44 %
MCH: 30.5 pg (ref 26.6–33.0)
MCHC: 33.4 g/dL (ref 31.5–35.7)
MCV: 91 fL (ref 79–97)
Monocytes Absolute: 0.3 10*3/uL (ref 0.1–0.9)
Monocytes: 6 %
Neutrophils Absolute: 2.5 10*3/uL (ref 1.4–7.0)
Neutrophils: 44 %
Platelets: 250 10*3/uL (ref 150–379)
RBC: 4.29 x10E6/uL (ref 3.77–5.28)
RDW: 13.3 % (ref 12.3–15.4)
WBC: 5.8 10*3/uL (ref 3.4–10.8)

## 2017-02-12 LAB — HEMOGLOBIN A1C
Est. average glucose Bld gHb Est-mCnc: 111 mg/dL
Hgb A1c MFr Bld: 5.5 % (ref 4.8–5.6)

## 2017-02-12 LAB — TSH: TSH: 2.99 u[IU]/mL (ref 0.450–4.500)

## 2017-02-12 LAB — HIV ANTIBODY (ROUTINE TESTING W REFLEX): HIV Screen 4th Generation wRfx: NONREACTIVE

## 2017-02-13 ENCOUNTER — Telehealth: Payer: Self-pay

## 2017-02-13 NOTE — Telephone Encounter (Signed)
-----   Message from Mar Daring, PA-C sent at 02/12/2017  8:54 AM EDT ----- Cholesterol has improved from last year. Continue simvastatin and continue to work on heart healthy dieting and increasing physical activity. A1c improved from 5.8 to 5.5. Continue the good work. All other labs are WNL and stable.

## 2017-02-13 NOTE — Telephone Encounter (Signed)
Patient advised as directed below.  Thanks,  -Joseline 

## 2017-04-09 ENCOUNTER — Ambulatory Visit
Admission: RE | Admit: 2017-04-09 | Discharge: 2017-04-09 | Disposition: A | Discharge status: Home / Self Care | Payer: BLUE CROSS/BLUE SHIELD | Source: Ambulatory Visit | Attending: Physician Assistant | Admitting: Physician Assistant

## 2017-04-09 DIAGNOSIS — Z1231 Encounter for screening mammogram for malignant neoplasm of breast: Secondary | ICD-10-CM | POA: Diagnosis present

## 2017-04-09 DIAGNOSIS — Z1239 Encounter for other screening for malignant neoplasm of breast: Secondary | ICD-10-CM

## 2017-04-16 ENCOUNTER — Telehealth: Payer: Self-pay

## 2017-04-16 NOTE — Telephone Encounter (Signed)
Pt advised.  Drozdowski, CMA  

## 2017-04-16 NOTE — Telephone Encounter (Signed)
-----   Message from Mar Daring, [place] sent at 04/16/2017  2:14 PM EDT ----- Normal mammogram. Repeat screening in one year.

## 2017-06-18 ENCOUNTER — Telehealth: Payer: Self-pay | Admitting: Physician Assistant

## 2017-06-19 ENCOUNTER — Ambulatory Visit (INDEPENDENT_AMBULATORY_CARE_PROVIDER_SITE_OTHER): Payer: BLUE CROSS/BLUE SHIELD | Admitting: Physician Assistant

## 2017-06-19 ENCOUNTER — Encounter: Payer: Self-pay | Admitting: Physician Assistant

## 2017-06-19 VITALS — BP 138/72 | HR 68 | Temp 98.0°F | Resp 16 | Ht 65.5 in | Wt 209.0 lb

## 2017-06-19 DIAGNOSIS — Z1211 Encounter for screening for malignant neoplasm of colon: Secondary | ICD-10-CM | POA: Diagnosis not present

## 2017-06-19 DIAGNOSIS — J0140 Acute pansinusitis, unspecified: Secondary | ICD-10-CM | POA: Diagnosis not present

## 2017-06-19 MED ORDER — AMOXICILLIN-POT CLAVULANATE 875-125 MG PO TABS: 1.0000 | ORAL_TABLET | Freq: Two times a day (BID) | ORAL | 0 refills | Status: DC

## 2017-06-19 NOTE — Addendum Note (Signed)
Addended by: Mar Daring on: 06/19/2017 02:49 PM   Modules accepted: Orders

## 2017-06-19 NOTE — Progress Notes (Addendum)
Patient: Sharon Parker Female    DOB: Mar 29, 1956   61 y.o.   MRN: [id number] Visit Date: 06/19/2017  Today's Provider: Mar Daring, PA-C   Chief Complaint  Patient presents with  . Urinary Tract Infection   Subjective:    HPI Upper Respiratory Infection: Patient complains of symptoms of a URI, possible sinusitis. Symptoms include bilateral ear pain, congestion, sore throat and swollen glands. Onset of symptoms was 4 weeks ago, gradually worsening since that time. She also c/o congestion, facial pain, post nasal drip, productive cough with  green colored sputum, sinus pressure and sore throat for the past 4 weeks .  She is drinking plenty of fluids. Evaluation to date: none. Treatment to date: antihistamines, decongestants and nasal steroids.     No Known Allergies   Current Outpatient Prescriptions:  .  citalopram (CELEXA) 20 MG tablet, Take 1 tablet (20 mg total) by mouth daily., Disp: 30 tablet, Rfl: 5 .  fluticasone (FLONASE) 50 MCG/ACT nasal spray, instill 2 sprays into each nostril once daily, Disp: 16 g, Rfl: 10 .  simvastatin (ZOCOR) 20 MG tablet, Take 1 tablet (20 mg total) by mouth daily., Disp: 30 tablet, Rfl: 5  Review of Systems  Constitutional: Negative.   HENT: Positive for congestion, ear pain, postnasal drip, sinus pain, sinus pressure and sore throat.   Respiratory: Negative.   Cardiovascular: Negative.   Gastrointestinal: Negative.   Neurological: Positive for headaches.    Social History  Substance Use Topics  . Smoking status: Former Smoker    Packs/day: 1.00    Years: 25.00    Types: Cigarettes    Quit date: 11/19/2011  . Smokeless tobacco: Never Used  . Alcohol use No   Objective:   BP 138/72 (BP Location: Left Arm, Patient Position: Sitting, Cuff Size: Large)   Pulse 68   Temp 98 F (36.7 C) (Oral)   Resp 16   Ht 5' 5.5" (1.664 m)   Wt 209 lb (94.8 kg)   SpO2 96%   BMI 34.25 kg/m  Vitals:   06/19/17 1425  BP: 138/72    Pulse: 68  Resp: 16  Temp: 98 F (36.7 C)  TempSrc: Oral  SpO2: 96%  Weight: 209 lb (94.8 kg)  Height: 5' 5.5" (1.664 m)     Physical Exam  Constitutional: She appears well-developed and well-nourished. No distress.  HENT:  Head: Normocephalic and atraumatic.  Right Ear: Hearing, tympanic membrane, external ear and ear canal normal.  Left Ear: Hearing, tympanic membrane, external ear and ear canal normal.  Nose: Right sinus exhibits maxillary sinus tenderness and frontal sinus tenderness. Left sinus exhibits maxillary sinus tenderness and frontal sinus tenderness.  Mouth/Throat: Uvula is midline, oropharynx is clear and moist and mucous membranes are normal. No oropharyngeal exudate.  Neck: Normal range of motion. Neck supple. No tracheal deviation present. No thyromegaly present.  Cardiovascular: Normal rate, regular rhythm and normal heart sounds.  Exam reveals no gallop and no friction rub.   No murmur heard. Pulmonary/Chest: Effort normal and breath sounds normal. No stridor. No respiratory distress. She has no wheezes. She has no rales.  Lymphadenopathy:    She has no cervical adenopathy.  Skin: She is not diaphoretic.  Vitals reviewed.       Assessment & Plan:     1. Acute pansinusitis, recurrence not specified Worsening symptoms that have not responded to OTC medications. Will give augmentin as below. Continue allergy medications. Stay well hydrated and  get plenty of rest. Call if no symptom improvement or if symptoms worsen. - amoxicillin-clavulanate (AUGMENTIN) 875-125 MG tablet; Take 1 tablet by mouth 2 (two) times daily.  Dispense: 20 tablet; Refill: 0  2. Colon cancer screening Due for repeat colonoscopy. Referral placed as below. - Ambulatory referral to Gastroenterology       Mar Daring, PA-C  [facility]

## 2017-06-19 NOTE — Patient Instructions (Signed)

## 2017-08-31 ENCOUNTER — Ambulatory Visit: Payer: Self-pay

## 2017-09-17 ENCOUNTER — Encounter: Payer: Self-pay | Admitting: Family Medicine

## 2017-09-17 ENCOUNTER — Ambulatory Visit (INDEPENDENT_AMBULATORY_CARE_PROVIDER_SITE_OTHER): Payer: BLUE CROSS/BLUE SHIELD | Admitting: Family Medicine

## 2017-09-17 VITALS — BP 128/84 | HR 68 | Temp 98.2°F | Resp 16 | Wt 213.6 lb

## 2017-09-17 DIAGNOSIS — J0100 Acute maxillary sinusitis, unspecified: Secondary | ICD-10-CM

## 2017-09-17 MED ORDER — AMOXICILLIN 875 MG PO TABS: 875.0000 mg | ORAL_TABLET | Freq: Two times a day (BID) | ORAL | 0 refills | Status: DC

## 2017-09-17 NOTE — Patient Instructions (Signed)

## 2017-09-17 NOTE — Progress Notes (Signed)
Patient: Sharon Parker Female    DOB: 04/24/1956   61 y.o.   MRN: [id number] Visit Date: 09/17/2017  Today's Provider: Vernie Murders, PA   Chief Complaint  Patient presents with  . Sinusitis   Subjective:    Sinusitis  This is a new problem. Episode onset: last week. The problem has been gradually worsening since onset. There has been no fever. Associated symptoms include ear pain, a sore throat and swollen glands. (Post nasal drainage ) Treatments tried: Allergy & OTC cold medications. The treatment provided no relief.   Past Medical History:  Diagnosis Date  . Allergy   . Hyperlipidemia    Past Surgical History:  Procedure Laterality Date  . CESAREAN SECTION  1994  . HERNIA REPAIR    . NASAL SEPTUM SURGERY  1983  . NECK SURGERY  2002   disc in neck replaced C5-C6  . TONSILLECTOMY AND ADENOIDECTOMY  1970   Family History  Problem Relation Age of Onset  . Stroke Mother   . Heart disease Mother        CHF  . Cancer Father   . Diabetes Sister   . Obesity Sister   . Healthy Brother   . Healthy Sister   . Healthy Sister   . Healthy Sister   . Healthy Brother    No Known Allergies   Previous Medications   CITALOPRAM (CELEXA) 20 MG TABLET    Take 1 tablet (20 mg total) by mouth daily.   FLUTICASONE (FLONASE) 50 MCG/ACT NASAL SPRAY    instill 2 sprays into each nostril once daily   SIMVASTATIN (ZOCOR) 20 MG TABLET    Take 1 tablet (20 mg total) by mouth daily.    Review of Systems  Constitutional: Negative.   HENT: Positive for ear pain, postnasal drip and sore throat.   Respiratory: Negative.   Cardiovascular: Negative.     Social History  Substance Use Topics  . Smoking status: Former Smoker    Packs/day: 1.00    Years: 25.00    Types: Cigarettes    Quit date: 11/19/2011  . Smokeless tobacco: Never Used  . Alcohol use No   Objective:   BP 128/84 (BP Location: Right Arm, Patient Position: Sitting, Cuff Size: Normal)   Pulse 68   Temp 98.2 F  (36.8 C) (Oral)   Resp 16   Wt 213 lb 9.6 oz (96.9 kg)   SpO2 96%   BMI 35.00 kg/m   Physical Exam  Constitutional: She is oriented to person, place, and time. She appears well-developed and well-nourished. No distress.  HENT:  Head: Normocephalic and atraumatic.  Right Ear: Hearing and external ear normal.  Left Ear: Hearing and external ear normal.  Nose: Nose normal.  Slightly reddened posterior pharynx without exudates. Sinuses transilluminate well but slight tender maxillaries with red and slightly raw nasal membranes.  Eyes: Conjunctivae and lids are normal. Right eye exhibits no discharge. Left eye exhibits no discharge. No scleral icterus.  Neck: Neck supple.  Cardiovascular: Normal rate.   Pulmonary/Chest: Effort normal and breath sounds normal. No respiratory distress. She has no wheezes. She has no rales.  Abdominal: Soft.  Musculoskeletal: Normal range of motion.  Lymphadenopathy:    She has no cervical adenopathy.  Neurological: She is alert and oriented to person, place, and time.  Skin: Skin is intact. No lesion and no rash noted.  Psychiatric: She has a normal mood and affect. Her speech is normal and behavior is normal. Thought  content normal.      Assessment & Plan:     1. Subacute maxillary sinusitis Onset yesterday with PND. No fever but has a little sore throat from drainage. Going on a cruise to the [place] (leaving today to drive to [facility]). May use Mucinex-DM prn and continue Flonase nasal spray. Add Amoxil and increase fluid intake. May gargle with warm saltwater for throat irritation. Recheck prn. - amoxicillin (AMOXIL) 875 MG tablet; Take 1 tablet (875 mg total) by mouth 2 (two) times daily.  Dispense: 20 tablet; Refill: 0

## 2018-02-03 ENCOUNTER — Other Ambulatory Visit: Payer: Self-pay | Admitting: Physician Assistant

## 2018-02-03 DIAGNOSIS — J3089 Other allergic rhinitis: Secondary | ICD-10-CM

## 2018-02-03 MED ORDER — FLUTICASONE PROPIONATE 50 MCG/ACT NA SUSP: 2.0000 | Freq: Every day | NASAL | 10 refills | Status: AC

## 2018-02-03 NOTE — Telephone Encounter (Signed)
Could you refill for the patient? She normally sees [place]. Thanks!

## 2018-02-03 NOTE — Telephone Encounter (Signed)
Patient needs refills on Flonase sent to [facility] in [place].

## 2018-02-24 ENCOUNTER — Encounter: Payer: Self-pay | Admitting: Physician Assistant

## 2018-02-24 ENCOUNTER — Ambulatory Visit: Payer: BLUE CROSS/BLUE SHIELD | Admitting: Physician Assistant

## 2018-02-24 VITALS — BP 148/82 | HR 74 | Temp 98.2°F | Resp 16 | Ht 66.0 in | Wt 201.2 lb

## 2018-02-24 DIAGNOSIS — Z1231 Encounter for screening mammogram for malignant neoplasm of breast: Secondary | ICD-10-CM

## 2018-02-24 DIAGNOSIS — Z124 Encounter for screening for malignant neoplasm of cervix: Secondary | ICD-10-CM | POA: Diagnosis not present

## 2018-02-24 DIAGNOSIS — Z1211 Encounter for screening for malignant neoplasm of colon: Secondary | ICD-10-CM

## 2018-02-24 DIAGNOSIS — R7309 Other abnormal glucose: Secondary | ICD-10-CM

## 2018-02-24 DIAGNOSIS — E7800 Pure hypercholesterolemia, unspecified: Secondary | ICD-10-CM | POA: Diagnosis not present

## 2018-02-24 DIAGNOSIS — Z0000 Encounter for general adult medical examination without abnormal findings: Secondary | ICD-10-CM

## 2018-02-24 DIAGNOSIS — E039 Hypothyroidism, unspecified: Secondary | ICD-10-CM

## 2018-02-24 DIAGNOSIS — E038 Other specified hypothyroidism: Secondary | ICD-10-CM

## 2018-02-24 NOTE — Patient Instructions (Signed)
Health Maintenance for Postmenopausal Women Menopause is a normal process in which your reproductive ability comes to an end. This process happens gradually over a span of months to years, usually between the ages of 22 and 9. Menopause is complete when you have missed 12 consecutive menstrual periods. It is important to talk with your health care provider about some of the most common conditions that affect postmenopausal women, such as heart disease, cancer, and bone loss (osteoporosis). Adopting a healthy lifestyle and getting preventive care can help to promote your health and wellness. Those actions can also lower your chances of developing some of these common conditions. What should I know about menopause? During menopause, you may experience a number of symptoms, such as:  Moderate-to-severe hot flashes.  Night sweats.  Decrease in sex drive.  Mood swings.  Headaches.  Tiredness.  Irritability.  Memory problems.  Insomnia.  Choosing to treat or not to treat menopausal changes is an individual decision that you make with your health care provider. What should I know about hormone replacement therapy and supplements? Hormone therapy products are effective for treating symptoms that are associated with menopause, such as hot flashes and night sweats. Hormone replacement carries certain risks, especially as you become older. If you are thinking about using estrogen or estrogen with progestin treatments, discuss the benefits and risks with your health care provider. What should I know about heart disease and stroke? Heart disease, heart attack, and stroke become more likely as you age. This may be due, in part, to the hormonal changes that your body experiences during menopause. These can affect how your body processes dietary fats, triglycerides, and cholesterol. Heart attack and stroke are both medical emergencies. There are many things that you can do to help prevent heart disease  and stroke:  Have your blood pressure checked at least every 1-2 years. High blood pressure causes heart disease and increases the risk of stroke.  If you are 53-22 years old, ask your health care provider if you should take aspirin to prevent a heart attack or a stroke.  Do not use any tobacco products, including cigarettes, chewing tobacco, or electronic cigarettes. If you need help quitting, ask your health care provider.  It is important to eat a healthy diet and maintain a healthy weight. ? Be sure to include plenty of vegetables, fruits, low-fat dairy products, and lean protein. ? Avoid eating foods that are high in solid fats, added sugars, or salt (sodium).  Get regular exercise. This is one of the most important things that you can do for your health. ? Try to exercise for at least 150 minutes each week. The type of exercise that you do should increase your heart rate and make you sweat. This is known as moderate-intensity exercise. ? Try to do strengthening exercises at least twice each week. Do these in addition to the moderate-intensity exercise.  Know your numbers.Ask your health care provider to check your cholesterol and your blood glucose. Continue to have your blood tested as directed by your health care provider.  What should I know about cancer screening? There are several types of cancer. Take the following steps to reduce your risk and to catch any cancer development as early as possible. Breast Cancer  Practice breast self-awareness. ? This means understanding how your breasts normally appear and feel. ? It also means doing regular breast self-exams. Let your health care provider know about any changes, no matter how small.  If you are 40  or older, have a clinician do a breast exam (clinical breast exam or CBE) every year. Depending on your age, family history, and medical history, it may be recommended that you also have a yearly breast X-ray (mammogram).  If you  have a family history of breast cancer, talk with your health care provider about genetic screening.  If you are at high risk for breast cancer, talk with your health care provider about having an MRI and a mammogram every year.  Breast cancer (BRCA) gene test is recommended for women who have family members with BRCA-related cancers. Results of the assessment will determine the need for genetic counseling and BRCA1 and for BRCA2 testing. BRCA-related cancers include these types: ? Breast. This occurs in males or females. ? Ovarian. ? Tubal. This may also be called fallopian tube cancer. ? Cancer of the abdominal or pelvic lining (peritoneal cancer). ? Prostate. ? Pancreatic.  Cervical, Uterine, and Ovarian Cancer Your health care provider may recommend that you be screened regularly for cancer of the pelvic organs. These include your ovaries, uterus, and vagina. This screening involves a pelvic exam, which includes checking for microscopic changes to the surface of your cervix (Pap test).  For women ages 21-65, health care providers may recommend a pelvic exam and a Pap test every three years. For women ages 79-65, they may recommend the Pap test and pelvic exam, combined with testing for human papilloma virus (HPV), every five years. Some types of HPV increase your risk of cervical cancer. Testing for HPV may also be done on women of any age who have unclear Pap test results.  Other health care providers may not recommend any screening for nonpregnant women who are considered low risk for pelvic cancer and have no symptoms. Ask your health care provider if a screening pelvic exam is right for you.  If you have had past treatment for cervical cancer or a condition that could lead to cancer, you need Pap tests and screening for cancer for at least 20 years after your treatment. If Pap tests have been discontinued for you, your risk factors (such as having a new sexual partner) need to be  reassessed to determine if you should start having screenings again. Some women have medical problems that increase the chance of getting cervical cancer. In these cases, your health care provider may recommend that you have screening and Pap tests more often.  If you have a family history of uterine cancer or ovarian cancer, talk with your health care provider about genetic screening.  If you have vaginal bleeding after reaching menopause, tell your health care provider.  There are currently no reliable tests available to screen for ovarian cancer.  Lung Cancer Lung cancer screening is recommended for adults 69-62 years old who are at high risk for lung cancer because of a history of smoking. A yearly low-dose CT scan of the lungs is recommended if you:  Currently smoke.  Have a history of at least 30 pack-years of smoking and you currently smoke or have quit within the past 15 years. A pack-year is smoking an average of one pack of cigarettes per day for one year.  Yearly screening should:  Continue until it has been 15 years since you quit.  Stop if you develop a health problem that would prevent you from having lung cancer treatment.  Colorectal Cancer  This type of cancer can be detected and can often be prevented.  Routine colorectal cancer screening usually begins at  age 42 and continues through age 45.  If you have risk factors for colon cancer, your health care provider may recommend that you be screened at an earlier age.  If you have a family history of colorectal cancer, talk with your health care provider about genetic screening.  Your health care provider may also recommend using home test kits to check for hidden blood in your stool.  A small camera at the end of a tube can be used to examine your colon directly (sigmoidoscopy or colonoscopy). This is done to check for the earliest forms of colorectal cancer.  Direct examination of the colon should be repeated every  5-10 years until age 71. However, if early forms of precancerous polyps or small growths are found or if you have a family history or genetic risk for colorectal cancer, you may need to be screened more often.  Skin Cancer  Check your skin from head to toe regularly.  Monitor any moles. Be sure to tell your health care provider: ? About any new moles or changes in moles, especially if there is a change in a mole's shape or color. ? If you have a mole that is larger than the size of a pencil eraser.  If any of your family members has a history of skin cancer, especially at a young age, talk with your health care provider about genetic screening.  Always use sunscreen. Apply sunscreen liberally and repeatedly throughout the day.  Whenever you are outside, protect yourself by wearing long sleeves, pants, a wide-brimmed hat, and sunglasses.  What should I know about osteoporosis? Osteoporosis is a condition in which bone destruction happens more quickly than new bone creation. After menopause, you may be at an increased risk for osteoporosis. To help prevent osteoporosis or the bone fractures that can happen because of osteoporosis, the following is recommended:  If you are 46-71 years old, get at least 1,000 mg of calcium and at least 600 mg of vitamin D per day.  If you are older than age 55 but younger than age 65, get at least 1,200 mg of calcium and at least 600 mg of vitamin D per day.  If you are older than age 54, get at least 1,200 mg of calcium and at least 800 mg of vitamin D per day.  Smoking and excessive alcohol intake increase the risk of osteoporosis. Eat foods that are rich in calcium and vitamin D, and do weight-bearing exercises several times each week as directed by your health care provider. What should I know about how menopause affects my mental health? Depression may occur at any age, but it is more common as you become older. Common symptoms of depression  include:  Low or sad mood.  Changes in sleep patterns.  Changes in appetite or eating patterns.  Feeling an overall lack of motivation or enjoyment of activities that you previously enjoyed.  Frequent crying spells.  Talk with your health care provider if you think that you are experiencing depression. What should I know about immunizations? It is important that you get and maintain your immunizations. These include:  Tetanus, diphtheria, and pertussis (Tdap) booster vaccine.  Influenza every year before the flu season begins.  Pneumonia vaccine.  Shingles vaccine.  Your health care provider may also recommend other immunizations. This information is not intended to replace advice given to you by your health care provider. Make sure you discuss any questions you have with your health care provider. Document Released: 12/28/2005  Document Revised: 05/25/2016 Document Reviewed: 08/09/2015 [employer] Interactive Patient Education  2018 [employer]  

## 2018-02-24 NOTE — Progress Notes (Signed)
Patient: Sharon Parker, Female    DOB: 08/27/1956, 62 y.o.   MRN: [id number] Visit Date: 02/24/2018  Today's Provider: Mar Daring, PA-C   Chief Complaint  Patient presents with  . Annual Exam   Subjective:    Annual physical exam Sharon Parker is a 62 y.o. female who presents today for health maintenance and complete physical. She feels well. She reports exercising. She reports she is sleeping well.  Last CPE:02/11/17 Mammogram:04/09/17 BI-RADS 1 Negative Colonoscopy:07/13/08-Normal Pap:01/12/15-Negative, HPV-Negative -----------------------------------------------------------------   Review of Systems  Constitutional: Negative.   HENT: Positive for postnasal drip and trouble swallowing.   Eyes: Negative.   Respiratory: Negative.   Cardiovascular: Negative.   Gastrointestinal: Negative.   Endocrine: Negative.   Genitourinary: Negative.   Musculoskeletal: Negative.   Skin: Negative.   Allergic/Immunologic: Positive for environmental allergies.  Neurological: Negative.   Hematological: Negative.   Psychiatric/Behavioral: Negative.     Social History      She  reports that she quit smoking about 6 years ago. Her smoking use included cigarettes. She has a 25.00 pack-year smoking history. She has never used smokeless tobacco. She reports that she does not drink alcohol or use drugs.       Social History   Socioeconomic History  . Marital status: Married    Spouse name: Not on file  . Number of children: Not on file  . Years of education: Not on file  . Highest education level: Not on file  Occupational History  . Not on file  Social Needs  . Financial resource strain: Not on file  . Food insecurity:    Worry: Not on file    Inability: Not on file  . Transportation needs:    Medical: Not on file    Non-medical: Not on file  Tobacco Use  . Smoking status: Former Smoker    Packs/day: 1.00    Years: 25.00    Pack years: 25.00    Types:  Cigarettes    Last attempt to quit: 11/19/2011    Years since quitting: 6.2  . Smokeless tobacco: Never Used  Substance and Sexual Activity  . Alcohol use: No  . Drug use: No  . Sexual activity: Never    Birth control/protection: None  Lifestyle  . Physical activity:    Days per week: Not on file    Minutes per session: Not on file  . Stress: Not on file  Relationships  . Social connections:    Talks on phone: Not on file    Gets together: Not on file    Attends religious service: Not on file    Active member of club or organization: Not on file    Attends meetings of clubs or organizations: Not on file    Relationship status: Not on file  Other Topics Concern  . Not on file  Social History Narrative  . Not on file    Past Medical History:  Diagnosis Date  . Allergy   . Hyperlipidemia      Patient Active Problem List   Diagnosis Date Noted  . Abnormal blood sugar 01/23/2016  . Depression 01/23/2016  . Adaptation reaction 01/20/2016  . Adrenal adenoma 01/20/2016  . Allergic rhinitis 01/20/2016  . Adult BMI 30+ 01/20/2016  . Adiposity 01/20/2016  . Arthritis, degenerative 01/20/2016  . Subclinical hypothyroidism 01/20/2016  . Compulsive tobacco user syndrome 01/20/2016  . Hypercholesteremia 05/17/2015    Past Surgical History:  Procedure Laterality  Date  . [place]  . HERNIA REPAIR    . NASAL SEPTUM SURGERY  1983  . NECK SURGERY  2002   disc in neck replaced C5-C6  . TONSILLECTOMY AND ADENOIDECTOMY  1970    Family History        Family Status  Relation Name Status  . Mother  Alive  . Father  Deceased       lung cancer Nicotine dependence  . Sister  Alive  . Brother  Alive  . Sister  Alive  . Sister  Alive  . Sister  Alive  . Brother  Alive        Her family history includes Cancer in her father; Diabetes in her sister; Healthy in her brother, brother, sister, sister, and sister; Heart disease in her mother; Obesity in her sister;  Stroke in her mother.      No Known Allergies   Current Outpatient Medications:  .  fluticasone (FLONASE) 50 MCG/ACT nasal spray, Place 2 sprays into both nostrils daily., Disp: 16 g, Rfl: 10 .  simvastatin (ZOCOR) 20 MG tablet, Take 1 tablet (20 mg total) by mouth daily., Disp: 30 tablet, Rfl: 5 .  citalopram (CELEXA) 20 MG tablet, Take 1 tablet (20 mg total) by mouth daily. (Patient not taking: Reported on 09/17/2017), Disp: 30 tablet, Rfl: 5   Patient Care Team: Mar Daring, PA-C as PCP - General (Family Medicine)      Objective:   Vitals: BP (!) 148/82 (BP Location: Left Arm, Patient Position: Sitting, Cuff Size: Normal)   Pulse 74   Temp 98.2 F (36.8 C) (Oral)   Resp 16   Ht 5\' 6"  (1.676 m)   Wt 201 lb 3.2 oz (91.3 kg)   BMI 32.47 kg/m     Physical Exam  Constitutional: She is oriented to person, place, and time. She appears well-developed and well-nourished. No distress.  HENT:  Head: Normocephalic and atraumatic.  Right Ear: Hearing, tympanic membrane, external ear and ear canal normal.  Left Ear: Hearing, tympanic membrane, external ear and ear canal normal.  Nose: Nose normal.  Mouth/Throat: Uvula is midline, oropharynx is clear and moist and mucous membranes are normal. No oropharyngeal exudate.  Eyes: Pupils are equal, round, and reactive to light. Conjunctivae and EOM are normal. Right eye exhibits no discharge. Left eye exhibits no discharge. No scleral icterus.  Neck: Normal range of motion. Neck supple. No JVD present. Carotid bruit is not present. No tracheal deviation present. No thyromegaly present.  Cardiovascular: Normal rate, regular rhythm, normal heart sounds and intact distal pulses. Exam reveals no gallop and no friction rub.  No murmur heard. Pulmonary/Chest: Effort normal and breath sounds normal. No respiratory distress. She has no wheezes. She has no rales. She exhibits no tenderness. Right breast exhibits no inverted nipple, no mass, no  nipple discharge, no skin change and no tenderness. Left breast exhibits no inverted nipple, no mass, no nipple discharge, no skin change and no tenderness. No breast tenderness, discharge or bleeding. Breasts are symmetrical.  Abdominal: Soft. Bowel sounds are normal. She exhibits no distension and no mass. There is no tenderness. There is no rebound and no guarding. Hernia confirmed negative in the right inguinal area and confirmed negative in the left inguinal area.  Genitourinary: Rectum normal, vagina normal and uterus normal. No breast tenderness, discharge or bleeding. Pelvic exam was performed with patient supine. There is no rash, tenderness, lesion or injury on the right labia. There  is no rash, tenderness, lesion or injury on the left labia. Cervix exhibits no motion tenderness, no discharge and no friability. Right adnexum displays no mass, no tenderness and no fullness. Left adnexum displays no mass, no tenderness and no fullness. No erythema, tenderness or bleeding in the vagina. No signs of injury around the vagina. No vaginal discharge found.  Musculoskeletal: Normal range of motion. She exhibits no edema or tenderness.  Lymphadenopathy:    She has no cervical adenopathy.       Right: No inguinal adenopathy present.       Left: No inguinal adenopathy present.  Neurological: She is alert and oriented to person, place, and time. She has normal reflexes. No cranial nerve deficit. Coordination normal.  Skin: Skin is warm and dry. No rash noted. She is not diaphoretic.  Psychiatric: She has a normal mood and affect. Her behavior is normal. Judgment and thought content normal.  Vitals reviewed.    Depression Screen PHQ 2/9 Scores 02/24/2018 02/24/2018 09/17/2017 02/11/2017  PHQ - 2 Score 0 0 0 0  PHQ- 9 Score 0 - 0 0      Assessment & Plan:     Routine Health Maintenance and Physical Exam  Exercise Activities and Dietary recommendations Goals    None      Immunization History    Administered Date(s) Administered  . Influenza Split 08/24/2011, 09/20/2012  . Influenza,inj,Quad PF,6+ Mos 09/19/2013, 09/02/2014, 09/10/2015, 08/25/2016  . Influenza-Unspecified 08/24/2017  . Td 08/24/2011  . Tdap 08/24/2011  . Zoster 08/24/2011    Health Maintenance  Topic Date Due  . PAP SMEAR  01/12/2018  . INFLUENZA VACCINE  06/19/2018  . COLONOSCOPY  07/13/2018  . MAMMOGRAM  04/10/2019  . TETANUS/TDAP  08/23/2021  . Hepatitis C Screening  Completed  . HIV Screening  Completed     Discussed health benefits of physical activity, and encouraged her to engage in regular exercise appropriate for her age and condition.    1. Annual physical exam Normal physical exam today. Will check labs as below and f/u pending lab results. If labs are stable and WNL she will not need to have these rechecked for one year at her next annual physical exam. She is to call the office in the meantime if she has any acute issue, questions or concerns. - CBC with Differential/Platelet - Comprehensive metabolic panel - Hemoglobin A1c  2. Subclinical hypothyroidism Will check labs as below and f/u pending results. - TSH  3. Hypercholesteremia Stable on Simvastatin 20mg . Will check labs as below and f/u pending results. - Lipid panel  4. Abnormal blood sugar Diet controlled. Will check labs as below and f/u pending results. - Hemoglobin A1c  5. Screening for colon cancer Referral placed back to Dr. Bary Castilla for repeat screening colonoscopy.  - Ambulatory referral to General Surgery  6. Encounter for screening for cervical cancer  Pap collected today. Will send as below and f/u pending results. - Pap IG and HPV (high risk) DNA detection  7. Encounter for screening mammogram for breast cancer Breast exam today was normal. There is no family history of breast cancer. She does perform regular self breast exams. Mammogram was ordered as below. Information for [facility] Breast clinic was given to  patient so she may schedule her mammogram at her convenience. - MM DIGITAL SCREENING BILATERAL; Future  --------------------------------------------------------------------    Mar Daring, PA-C  [facility]

## 2018-02-25 ENCOUNTER — Encounter: Payer: Self-pay | Admitting: General Surgery

## 2018-02-26 LAB — PAP IG AND HPV HIGH-RISK
HPV, high-risk: NEGATIVE
PAP Smear Comment: 0

## 2018-02-26 LAB — SPECIMEN STATUS REPORT

## 2018-02-27 ENCOUNTER — Telehealth: Payer: Self-pay

## 2018-02-27 NOTE — Telephone Encounter (Signed)
LMTCB  Thanks,  - 

## 2018-02-27 NOTE — Telephone Encounter (Signed)
-----   Message from Mar Daring, [place] sent at 02/27/2018 12:48 PM EDT ----- Pap unfortunately was unsatisfactory due to insufficient cellularity. This was caused by having too much lubrication on the speculum. It was tested and HPV negative. We can either schedule to come back at your convenience for repeat pap only, or can wait and recheck again in one year.

## 2018-02-28 NOTE — Telephone Encounter (Signed)
Patient advised as below. Patient reports she will wait till next year.

## 2018-03-03 ENCOUNTER — Encounter: Payer: Self-pay | Admitting: Physician Assistant

## 2018-03-03 ENCOUNTER — Ambulatory Visit (INDEPENDENT_AMBULATORY_CARE_PROVIDER_SITE_OTHER): Payer: BLUE CROSS/BLUE SHIELD | Admitting: Physician Assistant

## 2018-03-03 VITALS — BP 140/80 | HR 81 | Temp 97.8°F | Resp 16 | Wt 200.2 lb

## 2018-03-03 DIAGNOSIS — Z124 Encounter for screening for malignant neoplasm of cervix: Secondary | ICD-10-CM

## 2018-03-03 NOTE — Progress Notes (Signed)
       Patient: Sharon Parker Female    DOB: 10/29/1956   62 y.o.   MRN: [id number] Visit Date: 03/03/2018  Today's Provider: Mar Daring, PA-C   Chief Complaint  Patient presents with  . Pap only   Subjective:    HPI Patient is coming in for pap only. Patient had a pap done on 04/08 Pap unfortunately was unsatisfactory due to insufficient cellularity due to excess lubrication, HPV negative.     No Known Allergies   Current Outpatient Medications:  .  fluticasone (FLONASE) 50 MCG/ACT nasal spray, Place 2 sprays into both nostrils daily., Disp: 16 g, Rfl: 10 .  Multiple Vitamin (MULTIVITAMINS PO), Take by mouth., Disp: , Rfl:  .  Omega-3 Fatty Acids (FISH OIL PEARLS PO), Take by mouth., Disp: , Rfl:  .  simvastatin (ZOCOR) 20 MG tablet, Take 1 tablet (20 mg total) by mouth daily., Disp: 30 tablet, Rfl: 5  Review of Systems  Constitutional: Negative.   Respiratory: Negative.   Cardiovascular: Negative.   Gastrointestinal: Negative.   Genitourinary: Negative.     Social History   Tobacco Use  . Smoking status: Former Smoker    Packs/day: 1.00    Years: 25.00    Pack years: 25.00    Types: Cigarettes    Last attempt to quit: 11/19/2011    Years since quitting: 6.2  . Smokeless tobacco: Never Used  Substance Use Topics  . Alcohol use: No   Objective:   BP 140/80 (BP Location: Left Arm, Patient Position: Sitting, Cuff Size: Normal)   Pulse 81   Temp 97.8 F (36.6 C) (Oral)   Resp 16   Wt 200 lb 3.2 oz (90.8 kg)   BMI 32.31 kg/m    Physical Exam  Constitutional: She appears well-developed and well-nourished.  Abdominal: Hernia confirmed negative in the right inguinal area and confirmed negative in the left inguinal area.  Genitourinary: Vagina normal and uterus normal. There is no rash, tenderness, lesion or injury on the right labia. There is no rash, tenderness, lesion or injury on the left labia. Cervix exhibits no motion tenderness, no discharge  and no friability. Right adnexum displays no mass, no tenderness and no fullness. Left adnexum displays no mass, no tenderness and no fullness.  Lymphadenopathy: No inguinal adenopathy noted on the right or left side.  Vitals reviewed.       Assessment & Plan:     1. Encounter for Papanicolaou smear for cervical cancer screening Pap collected today. Will send as below and f/u pending results. - Pap IG (Image Guided)       Mar Daring, PA-C  [facility]

## 2018-03-04 ENCOUNTER — Telehealth: Payer: Self-pay

## 2018-03-04 LAB — PAP IG (IMAGE GUIDED): PAP Smear Comment: 0

## 2018-03-04 LAB — CBC WITH DIFFERENTIAL/PLATELET
Basophils Absolute: 0.1 10*3/uL (ref 0.0–0.2)
Basos: 2 %
EOS (ABSOLUTE): 0.2 10*3/uL (ref 0.0–0.4)
Eos: 4 %
Hematocrit: 39.8 % (ref 34.0–46.6)
Hemoglobin: 13.1 g/dL (ref 11.1–15.9)
Immature Grans (Abs): 0 10*3/uL (ref 0.0–0.1)
Immature Granulocytes: 0 %
Lymphocytes Absolute: 1.9 10*3/uL (ref 0.7–3.1)
Lymphs: 39 %
MCH: 30.3 pg (ref 26.6–33.0)
MCHC: 32.9 g/dL (ref 31.5–35.7)
MCV: 92 fL (ref 79–97)
Monocytes Absolute: 0.6 10*3/uL (ref 0.1–0.9)
Monocytes: 12 %
Neutrophils Absolute: 2.1 10*3/uL (ref 1.4–7.0)
Neutrophils: 43 %
Platelets: 242 10*3/uL (ref 150–379)
RBC: 4.33 x10E6/uL (ref 3.77–5.28)
RDW: 14.2 % (ref 12.3–15.4)
WBC: 4.8 10*3/uL (ref 3.4–10.8)

## 2018-03-04 LAB — COMPREHENSIVE METABOLIC PANEL
ALT: 16 IU/L (ref 0–32)
AST: 19 IU/L (ref 0–40)
Albumin/Globulin Ratio: 2 (ref 1.2–2.2)
Albumin: 4.6 g/dL (ref 3.6–4.8)
Alkaline Phosphatase: 93 IU/L (ref 39–117)
BUN/Creatinine Ratio: 17 (ref 12–28)
BUN: 13 mg/dL (ref 8–27)
Bilirubin Total: 0.3 mg/dL (ref 0.0–1.2)
CO2: 27 mmol/L (ref 20–29)
Calcium: 9.2 mg/dL (ref 8.7–10.3)
Chloride: 102 mmol/L (ref 96–106)
Creatinine, Ser: 0.77 mg/dL (ref 0.57–1.00)
GFR calc Af Amer: 96 mL/min/{1.73_m2} (ref >59)
GFR calc non Af Amer: 83 mL/min/{1.73_m2} (ref >59)
Globulin, Total: 2.3 g/dL (ref 1.5–4.5)
Glucose: 103 mg/dL — ABNORMAL HIGH (ref 65–99)
Potassium: 4.2 mmol/L (ref 3.5–5.2)
Sodium: 143 mmol/L (ref 134–144)
Total Protein: 6.9 g/dL (ref 6.0–8.5)

## 2018-03-04 LAB — LIPID PANEL
Chol/HDL Ratio: 3.8 ratio (ref 0.0–4.4)
Cholesterol, Total: 178 mg/dL (ref 100–199)
HDL: 47 mg/dL (ref >39)
LDL Calculated: 104 mg/dL — ABNORMAL HIGH (ref 0–99)
Triglycerides: 136 mg/dL (ref 0–149)
VLDL Cholesterol Cal: 27 mg/dL (ref 5–40)

## 2018-03-04 LAB — HEMOGLOBIN A1C
Est. average glucose Bld gHb Est-mCnc: 117 mg/dL
Hgb A1c MFr Bld: 5.7 % — ABNORMAL HIGH (ref 4.8–5.6)

## 2018-03-04 LAB — TSH: TSH: 2.7 u[IU]/mL (ref 0.450–4.500)

## 2018-03-04 NOTE — Telephone Encounter (Signed)
-----   Message from Mar Daring, PA-C sent at 03/04/2018  8:24 AM EDT ----- Cholesterol normal at this time. A1c borderline increase from 5.5 to 5.7. Kidney and liver function normal. Thyroid normal. Blood count normal.

## 2018-03-04 NOTE — Telephone Encounter (Signed)
Patient advised as directed below. Patient requested a copy of her labs. Printed and placed up front ready for pick up.  Thanks,  -Joseline

## 2018-03-05 ENCOUNTER — Telehealth: Payer: Self-pay

## 2018-03-05 NOTE — Telephone Encounter (Signed)
-----   Message from Mar Daring, PA-C sent at 03/05/2018  8:46 AM EDT ----- Pap is normal.  Will repeat in 3-5 years.

## 2018-03-05 NOTE — Telephone Encounter (Signed)
Patient advised as directed below.  Thanks,  - 

## 2018-03-19 ENCOUNTER — Encounter: Payer: Self-pay | Admitting: *Deleted

## 2018-03-25 ENCOUNTER — Ambulatory Visit: Payer: BLUE CROSS/BLUE SHIELD | Admitting: General Surgery

## 2018-03-25 ENCOUNTER — Encounter: Payer: Self-pay | Admitting: General Surgery

## 2018-03-25 VITALS — BP 150/80 | HR 72 | Resp 14 | Ht 66.0 in | Wt 204.0 lb

## 2018-03-25 DIAGNOSIS — Z1211 Encounter for screening for malignant neoplasm of colon: Secondary | ICD-10-CM

## 2018-03-25 MED ORDER — POLYETHYLENE GLYCOL 3350 17 GM/SCOOP PO POWD: 1.0000 | Freq: Once | ORAL | 0 refills | Status: AC

## 2018-03-25 NOTE — Progress Notes (Signed)
Patient ID: Sharon Parker, female   DOB: 06-Jun-1956, 62 y.o.   MRN: [id number]  Chief Complaint  Patient presents with  . Colonoscopy    HPI Sharon Parker is a 62 y.o. female here today for a evaluation of a screening colonoscopy. Last colonoscopy 07/13/2008 Patient states no GI problems at this time. Moves bowels daily.  HPI  Past Medical History:  Diagnosis Date  . Allergy   . Hyperlipidemia     Past Surgical History:  Procedure Laterality Date  . CESAREAN SECTION  1994  . COLONOSCOPY  2009  . HERNIA REPAIR    . NASAL SEPTUM SURGERY  1983  . NECK SURGERY  2002   disc in neck replaced C5-C6  . TONSILLECTOMY AND ADENOIDECTOMY  1970    Family History  Problem Relation Age of Onset  . Stroke Mother   . Heart disease Mother        CHF  . Cancer Father        lung cancer  . Diabetes Sister   . Obesity Sister   . Healthy Brother   . Healthy Sister   . Healthy Sister   . Healthy Sister   . Healthy Brother     Social History Social History   Tobacco Use  . Smoking status: Former Smoker    Packs/day: 1.00    Years: 25.00    Pack years: 25.00    Types: Cigarettes    Last attempt to quit: 11/19/2011    Years since quitting: 6.3  . Smokeless tobacco: Never Used  Substance Use Topics  . Alcohol use: No  . Drug use: No    No Known Allergies  Current Outpatient Medications  Medication Sig Dispense Refill  . fluticasone (FLONASE) 50 MCG/ACT nasal spray Place 2 sprays into both nostrils daily. 16 g 10  . Multiple Vitamin (MULTIVITAMINS PO) Take by mouth.    . simvastatin (ZOCOR) 20 MG tablet Take 1 tablet (20 mg total) by mouth daily. 30 tablet 5  . polyethylene glycol powder (GLYCOLAX/MIRALAX) powder Take 255 g by mouth once for 1 dose. Mix whole container with 64 ounces of clear liquids 255 g 0   No current facility-administered medications for this visit.     Review of Systems Review of Systems  Constitutional: Negative.   Respiratory: Negative.    Cardiovascular: Negative.     Blood pressure (!) 150/80, pulse 72, resp. rate 14, height 5\' 6"  (1.676 m), weight 204 lb (92.5 kg).  Physical Exam Physical Exam  Constitutional: She is oriented to person, place, and time. She appears well-developed and well-nourished.  Cardiovascular: Normal rate, regular rhythm and normal heart sounds.  Pulmonary/Chest: Effort normal and breath sounds normal.  Neurological: She is alert and oriented to person, place, and time.  Skin: Skin is warm and dry.    Data Reviewed Colonoscopy dated July 13, 2008 was reviewed.  A normal study. CBC and comprehensive metabolic panel dated March 03, 2018 is remarkable only for a scant elevation of the fasting blood sugar.  Hemoglobin A1c 5.7.  Normal renal function.   Assessment    Candidate for screening colonoscopy.    Plan  Opportunity to make use of Cologuard testing reviewed, declined.  She apparently knows several people recently diagnosed with colon cancer and the decreased sensitivity of the Cologuard test is not appealing.  Colonoscopy with possible biopsy/polypectomy prn: Information regarding the procedure, including its potential risks and complications (including but not limited to perforation of the bowel,  which may require emergency surgery to repair, and bleeding) was verbally given to the patient. Educational information regarding lower intestinal endoscopy was given to the patient. Written instructions for how to complete the bowel prep using Miralax were provided. The importance of drinking ample fluids to avoid dehydration as a result of the prep emphasized.  HPI, Physical Exam, Assessment and Plan have been scribed under the direction and in the presence of Hervey Ard, MD.  Gaspar Cola, CMA  I have completed the exam and reviewed the above documentation for accuracy and completeness.  I agree with the above.  Haematologist has been used and any errors in dictation or  transcription are unintentional.  Hervey Ard, M.D., F.A.C.S.  The patient is scheduled for a Colonoscopy at [facility] on 04/30/18. They are aware to call the day before to get their arrival time. Miralax prescription has been sent into the patient's pharmacy. The patient is aware of date and instructions.  Documented by [place] LPN  Forest Gleason  03/25/2018, 5:40 PM

## 2018-03-25 NOTE — Patient Instructions (Addendum)
Colonoscopy, Adult A colonoscopy is an exam to look at the entire large intestine. During the exam, a lubricated, bendable tube is inserted into the anus and then passed into the rectum, colon, and other parts of the large intestine. A colonoscopy is often done as a part of normal colorectal screening or in response to certain symptoms, such as anemia, persistent diarrhea, abdominal pain, and blood in the stool. The exam can help screen for and diagnose medical problems, including:  Tumors.  Polyps.  Inflammation.  Areas of bleeding.  Tell a health care provider about:  Any allergies you have.  All medicines you are taking, including vitamins, herbs, eye drops, creams, and over-the-counter medicines.  Any problems you or family members have had with anesthetic medicines.  Any blood disorders you have.  Any surgeries you have had.  Any medical conditions you have.  Any problems you have had passing stool. What are the risks? Generally, this is a safe procedure. However, problems may occur, including:  Bleeding.  A tear in the intestine.  A reaction to medicines given during the exam.  Infection (rare).  What happens before the procedure? Eating and drinking restrictions Follow instructions from your health care provider about eating and drinking, which may include:  A few days before the procedure - follow a low-fiber diet. Avoid nuts, seeds, dried fruit, raw fruits, and vegetables.  1-3 days before the procedure - follow a clear liquid diet. Drink only clear liquids, such as clear broth or bouillon, black coffee or tea, clear juice, clear soft drinks or sports drinks, gelatin dessert, and popsicles. Avoid any liquids that contain red or purple dye.  On the day of the procedure - do not eat or drink anything during the 2 hours before the procedure, or within the time period that your health care provider recommends.  Bowel prep If you were prescribed an oral bowel prep  to clean out your colon:  Take it as told by your health care provider. Starting the day before your procedure, you will need to drink a large amount of medicated liquid. The liquid will cause you to have multiple loose stools until your stool is almost clear or light green.  If your skin or anus gets irritated from diarrhea, you may use these to relieve the irritation: ? Medicated wipes, such as adult wet wipes with aloe and vitamin E. ? A skin soothing-product like petroleum jelly.  If you vomit while drinking the bowel prep, take a break for up to 60 minutes and then begin the bowel prep again. If vomiting continues and you cannot take the bowel prep without vomiting, call your health care provider.  General instructions  Ask your health care provider about changing or stopping your regular medicines. This is especially important if you are taking diabetes medicines or blood thinners.  Plan to have someone take you home from the hospital or clinic. What happens during the procedure?  An IV tube may be inserted into one of your veins.  You will be given medicine to help you relax (sedative).  To reduce your risk of infection: ? Your health care team will wash or sanitize their hands. ? Your anal area will be washed with soap.  You will be asked to lie on your side with your knees bent.  Your health care provider will lubricate a long, thin, flexible tube. The tube will have a camera and a light on the end.  The tube will be inserted into your   anus.  The tube will be gently eased through your rectum and colon.  Air will be delivered into your colon to keep it open. You may feel some pressure or cramping.  The camera will be used to take images during the procedure.  A small tissue sample may be removed from your body to be examined under a microscope (biopsy). If any potential problems are found, the tissue will be sent to a lab for testing.  If small polyps are found, your  health care provider may remove them and have them checked for cancer cells.  The tube that was inserted into your anus will be slowly removed. The procedure may vary among health care providers and hospitals. What happens after the procedure?  Your blood pressure, heart rate, breathing rate, and blood oxygen level will be monitored until the medicines you were given have worn off.  Do not drive for 24 hours after the exam.  You may have a small amount of blood in your stool.  You may pass gas and have mild abdominal cramping or bloating due to the air that was used to inflate your colon during the exam.  It is up to you to get the results of your procedure. Ask your health care provider, or the department performing the procedure, when your results will be ready. This information is not intended to replace advice given to you by your health care provider. Make sure you discuss any questions you have with your health care provider. Document Released: 11/02/2000 Document Revised: 09/05/2016 Document Reviewed: 01/17/2016 Elsevier Interactive Patient Education  [employer].   The patient is scheduled for a Colonoscopy at [facility] on 04/30/18. They are aware to call the day before to get their arrival time. Miralax prescription has been sent into the patient's pharmacy. The patient is aware of date and instructions.

## 2018-04-21 ENCOUNTER — Telehealth: Payer: Self-pay | Admitting: *Deleted

## 2018-04-21 NOTE — Telephone Encounter (Signed)
Patient was contacted today and confirms no medication changes since her last office visit.   This patient reports that she has picked up Miralax prescription.  We will proceed with colonoscopy as scheduled for 04-30-18 at [facility].   Patient was encouraged to call the office should she have further questions.

## 2018-04-29 ENCOUNTER — Encounter: Payer: Self-pay | Admitting: Student

## 2018-04-30 ENCOUNTER — Ambulatory Visit: Payer: BLUE CROSS/BLUE SHIELD | Admitting: Anesthesiology

## 2018-04-30 ENCOUNTER — Ambulatory Visit
Admission: RE | Admit: 2018-04-30 | Discharge: 2018-04-30 | Disposition: A | Discharge status: Home / Self Care | Payer: BLUE CROSS/BLUE SHIELD | Source: Ambulatory Visit | Attending: General Surgery | Admitting: General Surgery

## 2018-04-30 ENCOUNTER — Encounter: Payer: Self-pay | Admitting: *Deleted

## 2018-04-30 ENCOUNTER — Encounter
Admission: RE | Disposition: A | Discharge status: Home / Self Care | Payer: Self-pay | Source: Ambulatory Visit | Attending: General Surgery

## 2018-04-30 ENCOUNTER — Other Ambulatory Visit: Payer: Self-pay

## 2018-04-30 DIAGNOSIS — E785 Hyperlipidemia, unspecified: Secondary | ICD-10-CM | POA: Diagnosis not present

## 2018-04-30 DIAGNOSIS — D128 Benign neoplasm of rectum: Secondary | ICD-10-CM | POA: Insufficient documentation

## 2018-04-30 DIAGNOSIS — Z1211 Encounter for screening for malignant neoplasm of colon: Secondary | ICD-10-CM

## 2018-04-30 DIAGNOSIS — Z87891 Personal history of nicotine dependence: Secondary | ICD-10-CM | POA: Insufficient documentation

## 2018-04-30 DIAGNOSIS — Z7951 Long term (current) use of inhaled steroids: Secondary | ICD-10-CM | POA: Insufficient documentation

## 2018-04-30 DIAGNOSIS — Z79899 Other long term (current) drug therapy: Secondary | ICD-10-CM | POA: Diagnosis not present

## 2018-04-30 DIAGNOSIS — K621 Rectal polyp: Secondary | ICD-10-CM | POA: Diagnosis not present

## 2018-04-30 HISTORY — PX: COLONOSCOPY WITH PROPOFOL: SHX5780

## 2018-04-30 SURGERY — COLONOSCOPY WITH PROPOFOL
Anesthesia: General

## 2018-04-30 MED ORDER — SODIUM CHLORIDE 0.9 % IV SOLN
INTRAVENOUS | Status: DC
  Administered 2018-04-30 (×2): via INTRAVENOUS

## 2018-04-30 MED ORDER — PROPOFOL 500 MG/50ML IV EMUL
INTRAVENOUS | Status: DC | PRN
  Administered 2018-04-30: 150 ug/kg/min via INTRAVENOUS

## 2018-04-30 MED ORDER — PROPOFOL 10 MG/ML IV BOLUS
INTRAVENOUS | Status: DC | PRN
  Administered 2018-04-30: 50 mg via INTRAVENOUS
  Administered 2018-04-30: 80 mg via INTRAVENOUS

## 2018-04-30 MED ORDER — PROPOFOL 500 MG/50ML IV EMUL
INTRAVENOUS | Status: AC
  Filled 2018-04-30: qty 50

## 2018-04-30 NOTE — Op Note (Signed)
[facility] Gastroenterology Patient Name: Sharon Parker Procedure Date: 04/30/2018 8:13 AM MRN: [id number] Account #: [id number] Date of Birth: 10/18/1956 Admit Type: Outpatient Age: 62 Room: [facility] ENDO ROOM 1 Gender: Female Note Status: Finalized Procedure:            Colonoscopy Indications:          Screening for colorectal malignant neoplasm Providers:            Robert Bellow, MD Referring MD:         Mar Daring (Referring MD) Medicines:            Monitored Anesthesia Care Complications:        No immediate complications. Procedure:            Pre-Anesthesia Assessment:                       - Prior to the procedure, a History and Physical was                        performed, and patient medications, allergies and                        sensitivities were reviewed. The patient's tolerance of                        previous anesthesia was reviewed.                       - The risks and benefits of the procedure and the                        sedation options and risks were discussed with the                        patient. All questions were answered and informed                        consent was obtained.                       After obtaining informed consent, the colonoscope was                        passed under direct vision. Throughout the procedure,                        the patient's blood pressure, pulse, and oxygen                        saturations were monitored continuously. The                        Colonoscope was introduced through the anus and                        advanced to the the cecum, identified by appendiceal                        orifice and ileocecal valve. The colonoscopy was  somewhat difficult due to significant looping.                        Successful completion of the procedure was aided by                        using manual pressure. The patient tolerated the     procedure well. The quality of the bowel preparation                        was excellent. Findings:      Two sessile polyps were found in the rectum. The polyps were 5 mm in       size. These were biopsied with a cold forceps for histology. Impression:           - Two 5 mm polyps in the rectum. Biopsied. Recommendation:       - Telephone endoscopist for pathology results in 1 week. Procedure Code(s):    --- Professional ---                       [phone], Colonoscopy, flexible; with biopsy, single or                        multiple Diagnosis Code(s):    --- Professional ---                       Z12.11, Encounter for screening for malignant neoplasm                        of colon                       K62.1, Rectal polyp CPT copyright 2017 American Medical Association. All rights reserved. The codes documented in this report are preliminary and upon coder review may  be revised to meet current compliance requirements. Robert Bellow, MD 04/30/2018 8:52:53 AM This report has been signed electronically. Number of Addenda: 0 Note Initiated On: 04/30/2018 8:13 AM Scope Withdrawal Time: 0 hours 10 minutes 51 seconds  Total Procedure Duration: 0 hours 19 minutes 49 seconds       [facility]

## 2018-04-30 NOTE — H&P (Signed)
Sharon Parker [id number] [birth year]     HPI: Healthy 62 y/o woman for screening colonoscopy. Tolerated prep well.   Medications Prior to Admission  Medication Sig Dispense Refill Last Dose  . fluticasone (FLONASE) 50 MCG/ACT nasal spray Place 2 sprays into both nostrils daily. 16 g 10 Past Week at Unknown time  . Multiple Vitamin (MULTIVITAMINS PO) Take by mouth.   Past Week at Unknown time  . simvastatin (ZOCOR) 20 MG tablet Take 1 tablet (20 mg total) by mouth daily. 30 tablet 5 Past Week at Unknown time   No Known Allergies Past Medical History:  Diagnosis Date  . Allergy   . Hyperlipidemia    Past Surgical History:  Procedure Laterality Date  . CESAREAN SECTION  1994  . COLONOSCOPY  2009  . HERNIA REPAIR    . NASAL SEPTUM SURGERY  1983  . NECK SURGERY  2002   disc in neck replaced C5-C6  . TONSILLECTOMY    . TONSILLECTOMY AND ADENOIDECTOMY  1970   Social History   Socioeconomic History  . Marital status: Married    Spouse name: Not on file  . Number of children: Not on file  . Years of education: Not on file  . Highest education level: Not on file  Occupational History  . Not on file  Social Needs  . Financial resource strain: Not on file  . Food insecurity:    Worry: Not on file    Inability: Not on file  . Transportation needs:    Medical: Not on file    Non-medical: Not on file  Tobacco Use  . Smoking status: Former Smoker    Packs/day: 1.00    Years: 25.00    Pack years: 25.00    Types: Cigarettes    Last attempt to quit: 11/19/2011    Years since quitting: 6.4  . Smokeless tobacco: Never Used  Substance and Sexual Activity  . Alcohol use: No  . Drug use: No  . Sexual activity: Not Currently    Birth control/protection: None  Lifestyle  . Physical activity:    Days per week: Not on file    Minutes per session: Not on file  . Stress: Not on file  Relationships  . Social connections:    Talks on phone: Not on file    Gets together: Not on  file    Attends religious service: Not on file    Active member of club or organization: Not on file    Attends meetings of clubs or organizations: Not on file    Relationship status: Not on file  . Intimate partner violence:    Fear of current or ex partner: Not on file    Emotionally abused: Not on file    Physically abused: Not on file    Forced sexual activity: Not on file  Other Topics Concern  . Not on file  Social History Narrative  . Not on file   Social History   Social History Narrative  . Not on file     ROS: Negative.     PE: HEENT: Negative. Lungs: Clear. Cardio: RR.  Assessment/Plan:  Proceed with planned endoscopy.  Forest Gleason [facility] 04/30/2018

## 2018-04-30 NOTE — Transfer of Care (Signed)
Immediate Anesthesia Transfer of Care Note  Patient: Sharon Parker  Procedure(s) Performed: COLONOSCOPY WITH PROPOFOL (N/A )  Patient Location: PACU  Anesthesia Type:General  Level of Consciousness: sedated  Airway & Oxygen Therapy: Patient Spontanous Breathing and Patient connected to nasal cannula oxygen  Post-op Assessment: Report given to RN and Post -op Vital signs reviewed and stable  Post vital signs: Reviewed and stable  Last Vitals:  Vitals Value Taken Time  BP 109/46 04/30/2018  8:55 AM  Temp 36.1 C 04/30/2018  8:55 AM  Pulse 73 04/30/2018  8:55 AM  Resp 14 04/30/2018  8:55 AM  SpO2 98 % 04/30/2018  8:55 AM    Last Pain:  Vitals:   04/30/18 0855  TempSrc:   PainSc: 0-No pain         Complications: No apparent anesthesia complications

## 2018-04-30 NOTE — Anesthesia Post-op Follow-up Note (Signed)
Anesthesia QCDR form completed.        

## 2018-04-30 NOTE — Anesthesia Preprocedure Evaluation (Signed)
Anesthesia Evaluation  Patient identified by MRN, date of birth, ID band Patient awake    Reviewed: Allergy & Precautions, H&P , NPO status , Patient's Chart, lab work & pertinent test results, reviewed documented beta blocker date and time   History of Anesthesia Complications Negative for: history of anesthetic complications  Airway Mallampati: II  TM Distance: >3 FB Neck ROM: full    Dental  (+) Dental Advidsory Given   Pulmonary neg pulmonary ROS, former smoker,           Cardiovascular Exercise Tolerance: Good negative cardio ROS       Neuro/Psych PSYCHIATRIC DISORDERS Depression negative neurological ROS     GI/Hepatic negative GI ROS, Neg liver ROS,   Endo/Other  negative endocrine ROS  Renal/GU negative Renal ROS  negative genitourinary   Musculoskeletal   Abdominal   Peds  Hematology negative hematology ROS (+)   Anesthesia Other Findings Past Medical History: No date: Allergy No date: Hyperlipidemia   Reproductive/Obstetrics negative OB ROS                             Anesthesia Physical Anesthesia Plan  ASA: II  Anesthesia Plan: General   Post-op Pain Management:    Induction: Intravenous  PONV Risk Score and Plan: 3 and Propofol infusion  Airway Management Planned: Nasal Cannula  Additional Equipment:   Intra-op Plan:   Post-operative Plan:   Informed Consent: I have reviewed the patients History and Physical, chart, labs and discussed the procedure including the risks, benefits and alternatives for the proposed anesthesia with the patient or authorized representative who has indicated his/her understanding and acceptance.   Dental Advisory Given  Plan Discussed with: Anesthesiologist, CRNA and Surgeon  Anesthesia Plan Comments:         Anesthesia Quick Evaluation

## 2018-05-01 NOTE — Anesthesia Postprocedure Evaluation (Signed)
Anesthesia Post Note  Patient: Sharon Parker  Procedure(s) Performed: COLONOSCOPY WITH PROPOFOL (N/A )  Patient location during evaluation: Endoscopy Anesthesia Type: General Level of consciousness: awake and alert Pain management: pain level controlled Vital Signs Assessment: post-procedure vital signs reviewed and stable Respiratory status: spontaneous breathing, nonlabored ventilation, respiratory function stable and patient connected to nasal cannula oxygen Cardiovascular status: blood pressure returned to baseline and stable Postop Assessment: no apparent nausea or vomiting Anesthetic complications: no     Last Vitals:  Vitals:   04/30/18 0905 04/30/18 0915  BP: (!) 103/43 120/60  Pulse: 68 70  Resp: (!) 21 15  Temp:    SpO2: 100% 100%    Last Pain:  Vitals:   05/01/18 0802  TempSrc:   PainSc: 0-No pain                 Martha Clan

## 2018-05-02 LAB — SURGICAL PATHOLOGY

## 2018-05-05 ENCOUNTER — Telehealth: Payer: Self-pay | Admitting: *Deleted

## 2018-05-05 NOTE — Telephone Encounter (Signed)
Notified patient as instructed, patient agrees. Patient placed in recalls for 10 year follow up colonoscopy

## 2018-05-06 ENCOUNTER — Encounter: Payer: Self-pay | Admitting: General Surgery

## 2018-05-28 ENCOUNTER — Ambulatory Visit
Admission: RE | Admit: 2018-05-28 | Discharge: 2018-05-28 | Disposition: A | Discharge status: Home / Self Care | Payer: BLUE CROSS/BLUE SHIELD | Source: Ambulatory Visit | Attending: Physician Assistant | Admitting: Physician Assistant

## 2018-05-28 DIAGNOSIS — Z1231 Encounter for screening mammogram for malignant neoplasm of breast: Secondary | ICD-10-CM | POA: Diagnosis not present

## 2018-06-02 ENCOUNTER — Encounter: Payer: Self-pay | Admitting: General Surgery

## 2018-07-04 ENCOUNTER — Other Ambulatory Visit: Payer: Self-pay | Admitting: Physician Assistant

## 2018-07-04 DIAGNOSIS — E7800 Pure hypercholesterolemia, unspecified: Secondary | ICD-10-CM

## 2018-07-04 MED ORDER — SIMVASTATIN 20 MG PO TABS: 20.0000 mg | ORAL_TABLET | Freq: Every day | ORAL | 1 refills | Status: DC

## 2018-07-04 NOTE — Telephone Encounter (Signed)
[facility] faxed a refill request for the following medication. Thanks CC  simvastatin (ZOCOR) 20 MG tablet

## 2018-09-09 ENCOUNTER — Ambulatory Visit: Payer: BLUE CROSS/BLUE SHIELD | Admitting: Family Medicine

## 2018-09-09 ENCOUNTER — Encounter: Payer: Self-pay | Admitting: Family Medicine

## 2018-09-09 VITALS — BP 140/62 | HR 97 | Temp 98.2°F | Resp 16 | Wt 213.8 lb

## 2018-09-09 DIAGNOSIS — J069 Acute upper respiratory infection, unspecified: Secondary | ICD-10-CM

## 2018-09-09 MED ORDER — HYDROCODONE-HOMATROPINE 5-1.5 MG/5ML PO SYRP
5.0000 mL | ORAL_SOLUTION | Freq: Four times a day (QID) | ORAL | 0 refills | Status: AC | PRN
Start: 2018-09-09 — End: 2018-09-14

## 2018-09-09 NOTE — Patient Instructions (Addendum)
Discussed use of Mucinex D for congestion, Delsym for cough, and Benadryl for postnasal drainage. Let me know if sinuses not improving over the weekend.

## 2018-09-09 NOTE — Progress Notes (Signed)
  Subjective:     Patient ID: Sharon Parker, female   DOB: 01-09-1956, 62 y.o.   MRN: [id number] Chief Complaint  Patient presents with  . Cough    Patient comes in office today with complaints of cough and congestion sine 08/03/18. Patient states over the past week symptoms have been worse, patient complains of shortness of breath, wheezing, post nasal drip, sinus pain and ear pain. Patient has tried taking otc generic Claritin and Flonase   HPI States she has been taking medication for allergies when she developed sore throat, PND, and accompanying cough over the last 3 days. No fever or sinus congestion.  Review of Systems     Objective:   Physical Exam  Constitutional: She appears well-developed and well-nourished. No distress.  Ears: T.M's intact without inflammation Sinuses: non-tender Throat: tonsils absent; no PND noted Neck: no cervical adenopathy Lungs: clear     Assessment:    1. Viral upper respiratory tract infection - HYDROcodone-homatropine (HYCODAN) 5-1.5 MG/5ML syrup; Take 5 mLs by mouth every 6 (six) hours as needed for up to 5 days. 5 ml 4-6 hours as needed for cough  Dispense: 100 mL; Refill: 0    Plan:    Discussed use of Mucinex D for congestion, Delsym for cough, and Benadryl for postnasal drainage. Call if not improving over the next few days.

## 2019-03-02 ENCOUNTER — Encounter: Payer: BLUE CROSS/BLUE SHIELD | Admitting: Physician Assistant

## 2019-03-11 ENCOUNTER — Other Ambulatory Visit: Payer: Self-pay

## 2019-03-11 DIAGNOSIS — E7800 Pure hypercholesterolemia, unspecified: Secondary | ICD-10-CM

## 2019-03-11 DIAGNOSIS — F339 Major depressive disorder, recurrent, unspecified: Secondary | ICD-10-CM

## 2019-03-11 MED ORDER — CITALOPRAM HYDROBROMIDE 20 MG PO TABS: 20.0000 mg | ORAL_TABLET | Freq: Every day | ORAL | 1 refills | Status: DC

## 2019-03-11 MED ORDER — SIMVASTATIN 20 MG PO TABS: 20.0000 mg | ORAL_TABLET | Freq: Every day | ORAL | 1 refills | Status: DC

## 2019-03-11 NOTE — Telephone Encounter (Signed)
Refilled both.

## 2019-03-11 NOTE — Telephone Encounter (Signed)
Would also like to re-start her Citalopram.  She has been off of it for about 1 year

## 2019-04-14 ENCOUNTER — Other Ambulatory Visit: Payer: Self-pay

## 2019-04-14 ENCOUNTER — Encounter: Payer: Self-pay | Admitting: Physician Assistant

## 2019-04-14 ENCOUNTER — Ambulatory Visit (INDEPENDENT_AMBULATORY_CARE_PROVIDER_SITE_OTHER): Payer: PRIVATE HEALTH INSURANCE | Admitting: Physician Assistant

## 2019-04-14 VITALS — BP 154/92 | HR 75 | Temp 98.3°F | Resp 16 | Wt 205.4 lb

## 2019-04-14 DIAGNOSIS — R7309 Other abnormal glucose: Secondary | ICD-10-CM | POA: Diagnosis not present

## 2019-04-14 DIAGNOSIS — Z6833 Body mass index (BMI) 33.0-33.9, adult: Secondary | ICD-10-CM

## 2019-04-14 DIAGNOSIS — E7800 Pure hypercholesterolemia, unspecified: Secondary | ICD-10-CM

## 2019-04-14 DIAGNOSIS — L0390 Cellulitis, unspecified: Secondary | ICD-10-CM | POA: Diagnosis not present

## 2019-04-14 DIAGNOSIS — Z0000 Encounter for general adult medical examination without abnormal findings: Secondary | ICD-10-CM

## 2019-04-14 DIAGNOSIS — E038 Other specified hypothyroidism: Secondary | ICD-10-CM

## 2019-04-14 DIAGNOSIS — Z1239 Encounter for other screening for malignant neoplasm of breast: Secondary | ICD-10-CM | POA: Diagnosis not present

## 2019-04-14 DIAGNOSIS — E6609 Other obesity due to excess calories: Secondary | ICD-10-CM

## 2019-04-14 DIAGNOSIS — F3342 Major depressive disorder, recurrent, in full remission: Secondary | ICD-10-CM

## 2019-04-14 DIAGNOSIS — E039 Hypothyroidism, unspecified: Secondary | ICD-10-CM

## 2019-04-14 MED ORDER — SULFAMETHOXAZOLE-TRIMETHOPRIM 800-160 MG PO TABS: 1.0000 | ORAL_TABLET | Freq: Two times a day (BID) | ORAL | 0 refills | Status: DC

## 2019-04-14 NOTE — Progress Notes (Signed)
Patient: Sharon Parker, Female    DOB: 10-Mar-1956, 64 y.o.   MRN: [id number] Visit Date: 04/14/2019  Today's Provider: Mar Daring, PA-C   Chief Complaint  Patient presents with  . Annual Exam   Subjective:     Annual physical exam Sharon Parker is a 63 y.o. female who presents today for health maintenance and complete physical. She feels well. She reports not exercising, but babysitting grand children daily.  She reports she is sleeping well. -----------------------------------------------------------------   Review of Systems  Constitutional: Negative.   HENT: Negative.   Eyes: Negative.   Respiratory: Negative.   Cardiovascular: Negative.   Gastrointestinal: Negative.   Endocrine: Negative.   Genitourinary: Negative.   Musculoskeletal: Negative.   Skin: Negative.   Allergic/Immunologic: Negative.   Neurological: Negative.   Hematological: Negative.   Psychiatric/Behavioral: Negative.     Social History      She  reports that she quit smoking about 7 years ago. Her smoking use included cigarettes. She has a 25.00 pack-year smoking history. She has never used smokeless tobacco. She reports that she does not drink alcohol or use drugs.       Social History   Socioeconomic History  . Marital status: Married    Spouse name: Not on file  . Number of children: Not on file  . Years of education: Not on file  . Highest education level: Not on file  Occupational History  . Not on file  Social Needs  . Financial resource strain: Not on file  . Food insecurity:    Worry: Not on file    Inability: Not on file  . Transportation needs:    Medical: Not on file    Non-medical: Not on file  Tobacco Use  . Smoking status: Former Smoker    Packs/day: 1.00    Years: 25.00    Pack years: 25.00    Types: Cigarettes    Last attempt to quit: 11/19/2011    Years since quitting: 7.4  . Smokeless tobacco: Never Used  Substance and Sexual Activity  .  Alcohol use: No  . Drug use: No  . Sexual activity: Not Currently    Birth control/protection: None  Lifestyle  . Physical activity:    Days per week: Not on file    Minutes per session: Not on file  . Stress: Not on file  Relationships  . Social connections:    Talks on phone: Not on file    Gets together: Not on file    Attends religious service: Not on file    Active member of club or organization: Not on file    Attends meetings of clubs or organizations: Not on file    Relationship status: Not on file  Other Topics Concern  . Not on file  Social History Narrative  . Not on file    Past Medical History:  Diagnosis Date  . Allergy   . Hyperlipidemia      Patient Active Problem List   Diagnosis Date Noted  . Abnormal blood sugar 01/23/2016  . Depression 01/23/2016  . Adaptation reaction 01/20/2016  . Adrenal adenoma 01/20/2016  . Allergic rhinitis 01/20/2016  . Adult BMI 30+ 01/20/2016  . Adiposity 01/20/2016  . Arthritis, degenerative 01/20/2016  . Subclinical hypothyroidism 01/20/2016  . Compulsive tobacco user syndrome 01/20/2016  . Hypercholesteremia 05/17/2015    Past Surgical History:  Procedure Laterality Date  . CESAREAN SECTION  1994  .  COLONOSCOPY  2009  . COLONOSCOPY WITH PROPOFOL N/A 04/30/2018   Procedure: COLONOSCOPY WITH PROPOFOL;  Surgeon: Robert Bellow, MD;  Location: [facility] ENDOSCOPY;  Service: Endoscopy;  Laterality: N/A;  . HERNIA REPAIR    . NASAL SEPTUM SURGERY  1983  . NECK SURGERY  2002   disc in neck replaced C5-C6  . TONSILLECTOMY    . TONSILLECTOMY AND ADENOIDECTOMY  1970    Family History        Family Status  Relation Name Status  . Mother  Alive  . Father  Deceased       lung cancer Nicotine dependence  . Sister  Alive  . Brother  Alive  . Sister  Alive  . Sister  Alive  . Sister  Alive  . Brother  Alive        Her family history includes Cancer in her father; Diabetes in her sister; Healthy in her brother,  brother, sister, sister, and sister; Heart disease in her mother; Obesity in her sister; Stroke in her mother.      No Known Allergies   Current Outpatient Medications:  .  cephALEXin (KEFLEX) 500 MG capsule, Take 500 mg by mouth 4 (four) times daily., Disp: , Rfl:  .  citalopram (CELEXA) 20 MG tablet, Take 1 tablet (20 mg total) by mouth daily., Disp: 90 tablet, Rfl: 1 .  fluticasone (FLONASE) 50 MCG/ACT nasal spray, Place 2 sprays into both nostrils daily., Disp: 16 g, Rfl: 10 .  Multiple Vitamin (MULTIVITAMINS PO), Take by mouth., Disp: , Rfl:  .  mupirocin ointment (BACTROBAN) 2 %, Place 1 application into the nose 2 (two) times daily., Disp: , Rfl:  .  simvastatin (ZOCOR) 20 MG tablet, Take 1 tablet (20 mg total) by mouth daily., Disp: 90 tablet, Rfl: 1   Patient Care Team: Mar Daring, PA-C as PCP - General (Family Medicine)    Objective:    Vitals: BP (!) 154/92 (BP Location: Left Arm, Patient Position: Sitting)   Pulse 75   Temp 98.3 F (36.8 C) (Oral)   Resp 16   Wt 205 lb 6.4 oz (93.2 kg)   SpO2 95%   BMI 33.15 kg/m    Vitals:   04/14/19 1421  BP: (!) 154/92  Pulse: 75  Resp: 16  Temp: 98.3 F (36.8 C)  TempSrc: Oral  SpO2: 95%  Weight: 205 lb 6.4 oz (93.2 kg)     Physical Exam Vitals signs reviewed.  Constitutional:      General: She is not in acute distress.    Appearance: Normal appearance. She is well-developed. She is obese. She is not ill-appearing or diaphoretic.  HENT:     Head: Normocephalic and atraumatic.     Right Ear: Tympanic membrane, ear canal and external ear normal.     Left Ear: Tympanic membrane, ear canal and external ear normal.     Nose: Nose normal.     Mouth/Throat:     Mouth: Mucous membranes are moist.     Pharynx: No oropharyngeal exudate.  Eyes:     General: No scleral icterus.       Right eye: No discharge.        Left eye: No discharge.     Extraocular Movements: Extraocular movements intact.      Conjunctiva/sclera: Conjunctivae normal.     Pupils: Pupils are equal, round, and reactive to light.  Neck:     Musculoskeletal: Normal range of motion and neck supple.  Thyroid: No thyromegaly.     Vascular: No carotid bruit or JVD.     Trachea: No tracheal deviation.  Cardiovascular:     Rate and Rhythm: Normal rate and regular rhythm.     Pulses: Normal pulses.     Heart sounds: Normal heart sounds. No murmur. No friction rub. No gallop.   Pulmonary:     Effort: Pulmonary effort is normal. No respiratory distress.     Breath sounds: Normal breath sounds. No wheezing or rales.  Chest:     Chest wall: No tenderness.     Breasts:        Right: Normal. No mass, nipple discharge, skin change or tenderness.        Left: Normal. No mass, nipple discharge, skin change or tenderness.  Abdominal:     General: Bowel sounds are normal. There is no distension.     Palpations: Abdomen is soft. There is no mass.     Tenderness: There is no abdominal tenderness. There is no guarding or rebound.  Musculoskeletal: Normal range of motion.        General: No tenderness.  Lymphadenopathy:     Cervical: No cervical adenopathy.     Upper Body:     Right upper body: No supraclavicular, axillary or pectoral adenopathy.     Left upper body: No supraclavicular, axillary or pectoral adenopathy.  Skin:    General: Skin is warm and dry.     Capillary Refill: Capillary refill takes less than 2 seconds.     Findings: No rash.  Neurological:     General: No focal deficit present.     Mental Status: She is alert and oriented to person, place, and time. Mental status is at baseline.  Psychiatric:        Mood and Affect: Mood normal.        Behavior: Behavior normal.        Thought Content: Thought content normal.        Judgment: Judgment normal.      Depression Screen PHQ 2/9 Scores 04/14/2019 04/14/2019 02/24/2018 02/24/2018  PHQ - 2 Score 0 0 0 0  PHQ- 9 Score 0 - 0 -       Assessment & Plan:      Routine Health Maintenance and Physical Exam  Exercise Activities and Dietary recommendations Goals   None     Immunization History  Administered Date(s) Administered  . Influenza Split 08/24/2011, 09/20/2012  . Influenza,inj,Quad PF,6+ Mos 09/19/2013, 09/02/2014, 09/10/2015, 08/25/2016  . Influenza-Unspecified 08/24/2017, 08/12/2018  . Td 08/24/2011  . Tdap 08/24/2011  . Zoster 08/24/2011    Health Maintenance  Topic Date Due  . INFLUENZA VACCINE  06/20/2019  . MAMMOGRAM  05/28/2020  . PAP SMEAR-Modifier  03/03/2021  . TETANUS/TDAP  08/23/2021  . COLONOSCOPY  04/30/2028  . Hepatitis C Screening  Completed  . HIV Screening  Completed     Discussed health benefits of physical activity, and encouraged her to engage in regular exercise appropriate for her age and condition.    1. Annual physical exam Normal physical exam today. Will check labs as below and f/u pending lab results. If labs are stable and WNL she will not need to have these rechecked for one year at her next annual physical exam. She is to call the office in the meantime if she has any acute issue, questions or concerns. - CBC w/Diff/Platelet - Comprehensive Metabolic Panel (CMET) - HgB A1c - Lipid Profile - TSH  2. Breast cancer screening Breast exam today was normal. There is no family history of breast cancer. She does perform regular self breast exams. Mammogram was ordered as below. Information for [facility] was given to patient so she may schedule her mammogram at her convenience. - MM 3D SCREEN BREAST BILATERAL; Future  3. Subclinical hypothyroidism No treatment. Will check labs as below and f/u pending results. - CBC w/Diff/Platelet - Comprehensive Metabolic Panel (CMET) - HgB A1c - Lipid Profile - TSH  4. Abnormal blood sugar Diet controlled. Will check labs as below and f/u pending results. - CBC w/Diff/Platelet - Comprehensive Metabolic Panel (CMET) - HgB A1c - Lipid  Profile - TSH  5. Hypercholesteremia Stable. Continue Simvastatin 20mg . Will check labs as below and f/u pending results. - CBC w/Diff/Platelet - Comprehensive Metabolic Panel (CMET) - HgB A1c - Lipid Profile - TSH  6. Class 1 obesity due to excess calories with serious comorbidity and body mass index (BMI) of 33.0 to 33.9 in adult Counseled patient on healthy lifestyle modifications including dieting and exercise.  - CBC w/Diff/Platelet - Comprehensive Metabolic Panel (CMET) - HgB A1c - Lipid Profile - TSH  7. Recurrent major depressive disorder, in full remission (Anza) Stable on citalopram 20mg . PHQ9 was 0 today. Will check labs as below and f/u pending results. - CBC w/Diff/Platelet - Comprehensive Metabolic Panel (CMET) - HgB A1c - Lipid Profile - TSH  8. Wound cellulitis Wound on posterior right ankle with warmth over the lateral malleolus and swelling of the right ankle. Has been on Keflex 500mg  QID since Sunday. Wound continues to progress. Will change therapy from Keflex to Bactrim as below. Call if still worsening.  - sulfamethoxazole-trimethoprim (BACTRIM DS) 800-160 MG tablet; Take 1 tablet by mouth 2 (two) times daily.  Dispense: 20 tablet; Refill: 0  --------------------------------------------------------------------    Mar Daring, PA-C  [facility]

## 2019-04-14 NOTE — Patient Instructions (Signed)
Health Maintenance for Postmenopausal Women Menopause is a normal process in which your reproductive ability comes to an end. This process happens gradually over a span of months to years, usually between the ages of 62 and 89. Menopause is complete when you have missed 12 consecutive menstrual periods. It is important to talk with your health care provider about some of the most common conditions that affect postmenopausal women, such as heart disease, cancer, and bone loss (osteoporosis). Adopting a healthy lifestyle and getting preventive care can help to promote your health and wellness. Those actions can also lower your chances of developing some of these common conditions. What should I know about menopause? During menopause, you may experience a number of symptoms, such as:  Moderate-to-severe hot flashes.  Night sweats.  Decrease in sex drive.  Mood swings.  Headaches.  Tiredness.  Irritability.  Memory problems.  Insomnia. Choosing to treat or not to treat menopausal changes is an individual decision that you make with your health care provider. What should I know about hormone replacement therapy and supplements? Hormone therapy products are effective for treating symptoms that are associated with menopause, such as hot flashes and night sweats. Hormone replacement carries certain risks, especially as you become older. If you are thinking about using estrogen or estrogen with progestin treatments, discuss the benefits and risks with your health care provider. What should I know about heart disease and stroke? Heart disease, heart attack, and stroke become more likely as you age. This may be due, in part, to the hormonal changes that your body experiences during menopause. These can affect how your body processes dietary fats, triglycerides, and cholesterol. Heart attack and stroke are both medical emergencies. There are many things that you can do to help prevent heart disease  and stroke:  Have your blood pressure checked at least every 1-2 years. High blood pressure causes heart disease and increases the risk of stroke.  If you are 79-72 years old, ask your health care provider if you should take aspirin to prevent a heart attack or a stroke.  Do not use any tobacco products, including cigarettes, chewing tobacco, or electronic cigarettes. If you need help quitting, ask your health care provider.  It is important to eat a healthy diet and maintain a healthy weight. ? Be sure to include plenty of vegetables, fruits, low-fat dairy products, and lean protein. ? Avoid eating foods that are high in solid fats, added sugars, or salt (sodium).  Get regular exercise. This is one of the most important things that you can do for your health. ? Try to exercise for at least 150 minutes each week. The type of exercise that you do should increase your heart rate and make you sweat. This is known as moderate-intensity exercise. ? Try to do strengthening exercises at least twice each week. Do these in addition to the moderate-intensity exercise.  Know your numbers.Ask your health care provider to check your cholesterol and your blood glucose. Continue to have your blood tested as directed by your health care provider.  What should I know about cancer screening? There are several types of cancer. Take the following steps to reduce your risk and to catch any cancer development as early as possible. Breast Cancer  Practice breast self-awareness. ? This means understanding how your breasts normally appear and feel. ? It also means doing regular breast self-exams. Let your health care provider know about any changes, no matter how small.  If you are 40 or  older, have a clinician do a breast exam (clinical breast exam or CBE) every year. Depending on your age, family history, and medical history, it may be recommended that you also have a yearly breast X-ray (mammogram).  If you  have a family history of breast cancer, talk with your health care provider about genetic screening.  If you are at high risk for breast cancer, talk with your health care provider about having an MRI and a mammogram every year.  Breast cancer (BRCA) gene test is recommended for women who have family members with BRCA-related cancers. Results of the assessment will determine the need for genetic counseling and BRCA1 and for BRCA2 testing. BRCA-related cancers include these types: ? Breast. This occurs in males or females. ? Ovarian. ? Tubal. This may also be called fallopian tube cancer. ? Cancer of the abdominal or pelvic lining (peritoneal cancer). ? Prostate. ? Pancreatic. Cervical, Uterine, and Ovarian Cancer Your health care provider may recommend that you be screened regularly for cancer of the pelvic organs. These include your ovaries, uterus, and vagina. This screening involves a pelvic exam, which includes checking for microscopic changes to the surface of your cervix (Pap test).  For women ages 21-65, health care providers may recommend a pelvic exam and a Pap test every three years. For women ages 39-65, they may recommend the Pap test and pelvic exam, combined with testing for human papilloma virus (HPV), every five years. Some types of HPV increase your risk of cervical cancer. Testing for HPV may also be done on women of any age who have unclear Pap test results.  Other health care providers may not recommend any screening for nonpregnant women who are considered low risk for pelvic cancer and have no symptoms. Ask your health care provider if a screening pelvic exam is right for you.  If you have had past treatment for cervical cancer or a condition that could lead to cancer, you need Pap tests and screening for cancer for at least 20 years after your treatment. If Pap tests have been discontinued for you, your risk factors (such as having a new sexual partner) need to be reassessed  to determine if you should start having screenings again. Some women have medical problems that increase the chance of getting cervical cancer. In these cases, your health care provider may recommend that you have screening and Pap tests more often.  If you have a family history of uterine cancer or ovarian cancer, talk with your health care provider about genetic screening.  If you have vaginal bleeding after reaching menopause, tell your health care provider.  There are currently no reliable tests available to screen for ovarian cancer. Lung Cancer Lung cancer screening is recommended for adults 57-50 years old who are at high risk for lung cancer because of a history of smoking. A yearly low-dose CT scan of the lungs is recommended if you:  Currently smoke.  Have a history of at least 30 pack-years of smoking and you currently smoke or have quit within the past 15 years. A pack-year is smoking an average of one pack of cigarettes per day for one year. Yearly screening should:  Continue until it has been 15 years since you quit.  Stop if you develop a health problem that would prevent you from having lung cancer treatment. Colorectal Cancer  This type of cancer can be detected and can often be prevented.  Routine colorectal cancer screening usually begins at age 12 and continues through  age 63.  If you have risk factors for colon cancer, your health care provider may recommend that you be screened at an earlier age.  If you have a family history of colorectal cancer, talk with your health care provider about genetic screening.  Your health care provider may also recommend using home test kits to check for hidden blood in your stool.  A small camera at the end of a tube can be used to examine your colon directly (sigmoidoscopy or colonoscopy). This is done to check for the earliest forms of colorectal cancer.  Direct examination of the colon should be repeated every 5-10 years until  age 75. However, if early forms of precancerous polyps or small growths are found or if you have a family history or genetic risk for colorectal cancer, you may need to be screened more often. Skin Cancer  Check your skin from head to toe regularly.  Monitor any moles. Be sure to tell your health care provider: ? About any new moles or changes in moles, especially if there is a change in a mole's shape or color. ? If you have a mole that is larger than the size of a pencil eraser.  If any of your family members has a history of skin cancer, especially at a young age, talk with your health care provider about genetic screening.  Always use sunscreen. Apply sunscreen liberally and repeatedly throughout the day.  Whenever you are outside, protect yourself by wearing long sleeves, pants, a wide-brimmed hat, and sunglasses. What should I know about osteoporosis? Osteoporosis is a condition in which bone destruction happens more quickly than new bone creation. After menopause, you may be at an increased risk for osteoporosis. To help prevent osteoporosis or the bone fractures that can happen because of osteoporosis, the following is recommended:  If you are 59-59 years old, get at least 1,000 mg of calcium and at least 600 mg of vitamin D per day.  If you are older than age 36 but younger than age 32, get at least 1,200 mg of calcium and at least 600 mg of vitamin D per day.  If you are older than age 47, get at least 1,200 mg of calcium and at least 800 mg of vitamin D per day. Smoking and excessive alcohol intake increase the risk of osteoporosis. Eat foods that are rich in calcium and vitamin D, and do weight-bearing exercises several times each week as directed by your health care provider. What should I know about how menopause affects my mental health? Depression may occur at any age, but it is more common as you become older. Common symptoms of depression include:  Low or sad mood.   Changes in sleep patterns.  Changes in appetite or eating patterns.  Feeling an overall lack of motivation or enjoyment of activities that you previously enjoyed.  Frequent crying spells. Talk with your health care provider if you think that you are experiencing depression. What should I know about immunizations? It is important that you get and maintain your immunizations. These include:  Tetanus, diphtheria, and pertussis (Tdap) booster vaccine.  Influenza every year before the flu season begins.  Pneumonia vaccine.  Shingles vaccine. Your health care provider may also recommend other immunizations. This information is not intended to replace advice given to you by your health care provider. Make sure you discuss any questions you have with your health care provider. Document Released: 12/28/2005 Document Revised: 05/25/2016 Document Reviewed: 08/09/2015 Elsevier Interactive Patient Education  2019 [place].

## 2019-04-15 LAB — CBC WITH DIFFERENTIAL/PLATELET
Basophils Absolute: 0.1 10*3/uL (ref 0.0–0.2)
Basos: 1 %
EOS (ABSOLUTE): 0.2 10*3/uL (ref 0.0–0.4)
Eos: 2 %
Hematocrit: 37.6 % (ref 34.0–46.6)
Hemoglobin: 12.7 g/dL (ref 11.1–15.9)
Immature Grans (Abs): 0 10*3/uL (ref 0.0–0.1)
Immature Granulocytes: 0 %
Lymphocytes Absolute: 2.7 10*3/uL (ref 0.7–3.1)
Lymphs: 28 %
MCH: 30.6 pg (ref 26.6–33.0)
MCHC: 33.8 g/dL (ref 31.5–35.7)
MCV: 91 fL (ref 79–97)
Monocytes Absolute: 0.7 10*3/uL (ref 0.1–0.9)
Monocytes: 7 %
Neutrophils Absolute: 5.9 10*3/uL (ref 1.4–7.0)
Neutrophils: 62 %
Platelets: 236 10*3/uL (ref 150–450)
RBC: 4.15 x10E6/uL (ref 3.77–5.28)
RDW: 12.9 % (ref 11.7–15.4)
WBC: 9.7 10*3/uL (ref 3.4–10.8)

## 2019-04-15 LAB — COMPREHENSIVE METABOLIC PANEL
ALT: 15 IU/L (ref 0–32)
AST: 19 IU/L (ref 0–40)
Albumin/Globulin Ratio: 2 (ref 1.2–2.2)
Albumin: 4.7 g/dL (ref 3.8–4.8)
Alkaline Phosphatase: 103 IU/L (ref 39–117)
BUN/Creatinine Ratio: 18 (ref 12–28)
BUN: 13 mg/dL (ref 8–27)
Bilirubin Total: 0.3 mg/dL (ref 0.0–1.2)
CO2: 24 mmol/L (ref 20–29)
Calcium: 9.2 mg/dL (ref 8.7–10.3)
Chloride: 100 mmol/L (ref 96–106)
Creatinine, Ser: 0.72 mg/dL (ref 0.57–1.00)
GFR calc Af Amer: 103 mL/min/{1.73_m2} (ref >59)
GFR calc non Af Amer: 89 mL/min/{1.73_m2} (ref >59)
Globulin, Total: 2.4 g/dL (ref 1.5–4.5)
Glucose: 99 mg/dL (ref 65–99)
Potassium: 4 mmol/L (ref 3.5–5.2)
Sodium: 138 mmol/L (ref 134–144)
Total Protein: 7.1 g/dL (ref 6.0–8.5)

## 2019-04-15 LAB — HEMOGLOBIN A1C
Est. average glucose Bld gHb Est-mCnc: 111 mg/dL
Hgb A1c MFr Bld: 5.5 % (ref 4.8–5.6)

## 2019-04-15 LAB — TSH: TSH: 1.82 u[IU]/mL (ref 0.450–4.500)

## 2019-04-15 LAB — LIPID PANEL
Chol/HDL Ratio: 3.6 ratio (ref 0.0–4.4)
Cholesterol, Total: 196 mg/dL (ref 100–199)
HDL: 55 mg/dL (ref >39)
LDL Calculated: 102 mg/dL — ABNORMAL HIGH (ref 0–99)
Triglycerides: 193 mg/dL — ABNORMAL HIGH (ref 0–149)
VLDL Cholesterol Cal: 39 mg/dL (ref 5–40)

## 2019-07-21 ENCOUNTER — Ambulatory Visit
Admission: RE | Admit: 2019-07-21 | Discharge: 2019-07-21 | Disposition: A | Discharge status: Home / Self Care | Payer: PRIVATE HEALTH INSURANCE | Source: Ambulatory Visit | Attending: Physician Assistant | Admitting: Physician Assistant

## 2019-07-21 DIAGNOSIS — Z1231 Encounter for screening mammogram for malignant neoplasm of breast: Secondary | ICD-10-CM | POA: Diagnosis not present

## 2019-07-21 DIAGNOSIS — Z1239 Encounter for other screening for malignant neoplasm of breast: Secondary | ICD-10-CM | POA: Diagnosis present

## 2019-07-22 ENCOUNTER — Telehealth: Payer: Self-pay

## 2019-07-22 NOTE — Telephone Encounter (Signed)
-----   Message from Mar Daring, [place] sent at 07/21/2019  5:13 PM EDT ----- Normal mammogram. Repeat screening in one year.

## 2019-07-22 NOTE — Telephone Encounter (Signed)
[email or web address]

## 2019-07-22 NOTE — Telephone Encounter (Signed)
Patient advised as below.  

## 2019-11-03 ENCOUNTER — Other Ambulatory Visit: Payer: Self-pay | Admitting: Physician Assistant

## 2019-11-03 DIAGNOSIS — F339 Major depressive disorder, recurrent, unspecified: Secondary | ICD-10-CM

## 2019-11-03 DIAGNOSIS — E7800 Pure hypercholesterolemia, unspecified: Secondary | ICD-10-CM

## 2019-11-03 NOTE — Telephone Encounter (Signed)
Requested Prescriptions  Pending Prescriptions Disp Refills  . simvastatin (ZOCOR) 20 MG tablet [Pharmacy Med Name: SIMVASTATIN 20MG  TABLETS] 90 tablet 1    Sig: TAKE 1 TABLET(20 MG) BY MOUTH DAILY     Cardiovascular:  Antilipid - Statins Failed - 11/03/2019  1:31 PM      Failed - LDL in normal range and within 360 days    LDL Calculated  Date Value Ref Range Status  04/14/2019 102 (H) 0 - 99 mg/dL Final         Failed - Triglycerides in normal range and within 360 days    Triglycerides  Date Value Ref Range Status  04/14/2019 193 (H) 0 - 149 mg/dL Final         Passed - Total Cholesterol in normal range and within 360 days    Cholesterol, Total  Date Value Ref Range Status  04/14/2019 196 100 - 199 mg/dL Final         Passed - HDL in normal range and within 360 days    HDL  Date Value Ref Range Status  04/14/2019 55 >39 mg/dL Final         Passed - Patient is not pregnant      Passed - Valid encounter within last 12 months    Recent Outpatient Visits          6 months ago Annual physical exam   [place], Clearnce Sorrel, PA-C   1 year ago Viral upper respiratory tract infection   [place], [place], [place]   1 year ago Encounter for Papanicolaou smear for cervical cancer screening   [place], Clearnce Sorrel, [place]   1 year ago Annual physical exam   [place], Clearnce Sorrel, [place]   2 years ago Subacute maxillary sinusitis   [facility] [place], [place], [place]             . citalopram (CELEXA) 20 MG tablet [Pharmacy Med Name: CITALOPRAM 20MG  TABLETS] 90 tablet 1    Sig: TAKE 1 TABLET(20 MG) BY MOUTH DAILY     Psychiatry:  Antidepressants - SSRI Failed - 11/03/2019  1:31 PM      Failed - Valid encounter within last 6 months    Recent Outpatient Visits          6 months ago Annual physical exam   [place], Clearnce Sorrel, [place]   1 year  ago Viral upper respiratory tract infection   [place], [place], [place]   1 year ago Encounter for Papanicolaou smear for cervical cancer screening   [place], Clearnce Sorrel, [place]   1 year ago Annual physical exam   [place], Clearnce Sorrel, [place]   2 years ago Subacute maxillary sinusitis   [facility] [place], [place], [place]             Passed - Completed PHQ-2 or PHQ-9 in the last 360 days.

## 2020-05-16 ENCOUNTER — Other Ambulatory Visit: Payer: Self-pay

## 2020-05-16 ENCOUNTER — Encounter: Payer: Self-pay | Admitting: Physician Assistant

## 2020-05-16 ENCOUNTER — Ambulatory Visit (INDEPENDENT_AMBULATORY_CARE_PROVIDER_SITE_OTHER): Payer: PRIVATE HEALTH INSURANCE | Admitting: Physician Assistant

## 2020-05-16 VITALS — BP 171/87 | HR 74 | Temp 97.2°F | Resp 16 | Ht 66.0 in | Wt 200.4 lb

## 2020-05-16 DIAGNOSIS — M26621 Arthralgia of right temporomandibular joint: Secondary | ICD-10-CM | POA: Diagnosis not present

## 2020-05-16 DIAGNOSIS — I498 Other specified cardiac arrhythmias: Secondary | ICD-10-CM | POA: Diagnosis not present

## 2020-05-16 DIAGNOSIS — I10 Essential (primary) hypertension: Secondary | ICD-10-CM | POA: Diagnosis not present

## 2020-05-16 DIAGNOSIS — R0789 Other chest pain: Secondary | ICD-10-CM

## 2020-05-16 MED ORDER — PREDNISONE 10 MG (21) PO TBPK: ORAL_TABLET | ORAL | 0 refills | Status: DC

## 2020-05-16 NOTE — Patient Instructions (Signed)

## 2020-05-16 NOTE — Progress Notes (Signed)
Established patient visit   Patient: Sharon Parker   DOB: 21-Jan-1956   64 y.o. Female  MRN: [id number] Visit Date: 05/16/2020  Today's healthcare provider: Mar Daring, PA-C   Chief Complaint  Patient presents with  . Edema  . Chest Pain   Subjective    Edema Patient complains of edema in right side of her face and lips. The edema has been mild. Onset of symptoms was 2 days ago, and patient reports symptoms have unchanged since that time. The edema is present all day. The patient states the problem is new. The swelling has been aggravated by with chewing. The swelling has been relieved by nothing. Associated factors include: nothing.She has been taking Tylenol and Aleve. Reports that she has a bug bite right side of her face but doesn't know if this is related to it.  She is also c/o chest pain left side off and on through the day for the past 2 weeks. Reports that it hurts even it she is just sitting on the sofa at home. Brother passed away from CVD/MI.   Patient Active Problem List   Diagnosis Date Noted  . Recurrent major depressive disorder, in full remission ([place]) 04/14/2019  . Abnormal blood sugar 01/23/2016  . Depression 01/23/2016  . Adaptation reaction 01/20/2016  . Adrenal adenoma 01/20/2016  . Allergic rhinitis 01/20/2016  . Adult BMI 30+ 01/20/2016  . Adiposity 01/20/2016  . Arthritis, degenerative 01/20/2016  . Subclinical hypothyroidism 01/20/2016  . Compulsive tobacco user syndrome 01/20/2016  . Hypercholesteremia 05/17/2015   Past Medical History:  Diagnosis Date  . Allergy   . Hyperlipidemia        Medications: Outpatient Medications Prior to Visit  Medication Sig  . citalopram (CELEXA) 20 MG tablet TAKE 1 TABLET(20 MG) BY MOUTH DAILY  . fluticasone (FLONASE) 50 MCG/ACT nasal spray Place 2 sprays into both nostrils daily.  . Multiple Vitamin (MULTIVITAMINS PO) Take by mouth.  . simvastatin (ZOCOR) 20 MG tablet TAKE 1 TABLET(20 MG) BY  MOUTH DAILY  . mupirocin ointment (BACTROBAN) 2 % Place 1 application into the nose 2 (two) times daily. (Patient not taking: Reported on 05/16/2020)  . [DISCONTINUED] sulfamethoxazole-trimethoprim (BACTRIM DS) 800-160 MG tablet Take 1 tablet by mouth 2 (two) times daily.   No facility-administered medications prior to visit.    Review of Systems  Constitutional: Negative for appetite change, chills, fatigue and fever.  HENT: Positive for ear pain and facial swelling. Negative for congestion, dental problem, drooling, mouth sores, sinus pressure, sinus pain, sore throat and tinnitus.   Eyes: Negative for visual disturbance.       Pressure behind right eye  Respiratory: Negative for cough, chest tightness and shortness of breath.   Cardiovascular: Positive for chest pain. Negative for palpitations and leg swelling.  Gastrointestinal: Negative for abdominal pain, nausea and vomiting.  Neurological: Negative for dizziness, weakness, light-headedness and headaches.    Last CBC Lab Results  Component Value Date   WBC 9.7 04/14/2019   HGB 12.7 04/14/2019   HCT 37.6 04/14/2019   MCV 91 04/14/2019   MCH 30.6 04/14/2019   RDW 12.9 04/14/2019   PLT 236 81/85/6314   Last metabolic panel Lab Results  Component Value Date   GLUCOSE 99 04/14/2019   NA 138 04/14/2019   K 4.0 04/14/2019   CL 100 04/14/2019   CO2 24 04/14/2019   BUN 13 04/14/2019   CREATININE 0.72 04/14/2019   GFRNONAA 89 04/14/2019   GFRAA  103 04/14/2019   CALCIUM 9.2 04/14/2019   PROT 7.1 04/14/2019   ALBUMIN 4.7 04/14/2019   LABGLOB 2.4 04/14/2019   AGRATIO 2.0 04/14/2019   BILITOT 0.3 04/14/2019   ALKPHOS 103 04/14/2019   AST 19 04/14/2019   ALT 15 04/14/2019   Last lipids Lab Results  Component Value Date   CHOL 196 04/14/2019   HDL 55 04/14/2019   LDLCALC 102 (H) 04/14/2019   TRIG 193 (H) 04/14/2019   CHOLHDL 3.6 04/14/2019      Objective    BP (!) 171/87 (BP Location: Left Arm, Patient Position:  Sitting, Cuff Size: Small)   Pulse 74   Temp (!) 97.2 F (36.2 C) (Temporal)   Resp 16   Ht 5\' 6"  (1.676 m)   Wt 200 lb 6.4 oz (90.9 kg)   BMI 32.35 kg/m  BP Readings from Last 3 Encounters:  05/16/20 (!) 171/87  04/14/19 (!) 154/92  09/09/18 140/62   Wt Readings from Last 3 Encounters:  05/16/20 200 lb 6.4 oz (90.9 kg)  04/14/19 205 lb 6.4 oz (93.2 kg)  09/09/18 213 lb 12.8 oz (97 kg)      Physical Exam Vitals reviewed.  Constitutional:      General: She is not in acute distress.    Appearance: She is well-developed. She is obese. She is not ill-appearing or diaphoretic.  HENT:     Head: Normocephalic and atraumatic.     Jaw: Tenderness, swelling and pain on movement present.      Right Ear: Hearing, tympanic membrane, ear canal and external ear normal.     Left Ear: Hearing, tympanic membrane, ear canal and external ear normal.     Nose: Nose normal.     Mouth/Throat:     Pharynx: Uvula midline. No oropharyngeal exudate.  Eyes:     General: No scleral icterus.       Right eye: No discharge.        Left eye: No discharge.     Extraocular Movements: Extraocular movements intact.     Conjunctiva/sclera: Conjunctivae normal.     Pupils: Pupils are equal, round, and reactive to light.  Neck:     Thyroid: No thyromegaly.     Trachea: No tracheal deviation.  Cardiovascular:     Rate and Rhythm: Normal rate and regular rhythm.     Heart sounds: Normal heart sounds. No murmur heard.  No friction rub. No gallop.   Pulmonary:     Effort: Pulmonary effort is normal. No respiratory distress.     Breath sounds: Normal breath sounds. No stridor. No wheezing or rales.  Musculoskeletal:     Cervical back: Normal range of motion and neck supple.  Lymphadenopathy:     Cervical: No cervical adenopathy.  Skin:    General: Skin is warm and dry.  Neurological:     Mental Status: She is alert.      No results found for any visits on 05/16/20.  Assessment & Plan     1.  Other chest pain EKG today shows atrial rhythm with rate of 68. No ST segment changes. With positive family history and having chest pain, even though atypical, will refer to cardiology for further evaluation. Consult appreciated.  - EKG 12-Lead - Ambulatory referral to Cardiology  2. Arthralgia of right temporomandibular joint Swelling and pain over right TMJ with clicking when opening mouth. Will use prednisone taper as below to try to calm down inflammation. May require further evaluation from dentist. May  also consider PT if patient agrees.  - predniSONE (STERAPRED UNI-PAK 21 TAB) 10 MG (21) TBPK tablet; 6 day taper; take as directed on package instructions  Dispense: 21 tablet; Refill: 0  3. Left atrial rhythm See above medical treatment plan for #1.  - Ambulatory referral to Cardiology  4. Essential hypertension Elevated today. Patient declines wanting to start medications at this time. Wants to try to work on limiting salt and see if pain and anxiety may be contributing. Has appt in July for CPE. Will recheck then. If still elevated will then start medications.    No follow-ups on file.      Reynolds Bowl, PA-C, have reviewed all documentation for this visit. The documentation on 05/17/20 for the exam, diagnosis, procedures, and orders are all accurate and complete.   Rubye Beach  [employer] [phone] (phone) [phone] (fax)  [place]

## 2020-05-17 ENCOUNTER — Encounter: Payer: Self-pay | Admitting: Physician Assistant

## 2020-06-10 ENCOUNTER — Other Ambulatory Visit: Payer: Self-pay

## 2020-06-10 ENCOUNTER — Ambulatory Visit (INDEPENDENT_AMBULATORY_CARE_PROVIDER_SITE_OTHER): Payer: PRIVATE HEALTH INSURANCE | Admitting: Physician Assistant

## 2020-06-10 ENCOUNTER — Encounter: Payer: Self-pay | Admitting: Physician Assistant

## 2020-06-10 VITALS — BP 183/96 | HR 66 | Temp 96.8°F | Ht 66.0 in | Wt 198.0 lb

## 2020-06-10 DIAGNOSIS — E039 Hypothyroidism, unspecified: Secondary | ICD-10-CM

## 2020-06-10 DIAGNOSIS — F339 Major depressive disorder, recurrent, unspecified: Secondary | ICD-10-CM | POA: Diagnosis not present

## 2020-06-10 DIAGNOSIS — E7800 Pure hypercholesterolemia, unspecified: Secondary | ICD-10-CM

## 2020-06-10 DIAGNOSIS — E038 Other specified hypothyroidism: Secondary | ICD-10-CM

## 2020-06-10 DIAGNOSIS — E6609 Other obesity due to excess calories: Secondary | ICD-10-CM | POA: Insufficient documentation

## 2020-06-10 DIAGNOSIS — Z6831 Body mass index (BMI) 31.0-31.9, adult: Secondary | ICD-10-CM

## 2020-06-10 DIAGNOSIS — Z87891 Personal history of nicotine dependence: Secondary | ICD-10-CM

## 2020-06-10 DIAGNOSIS — Z0000 Encounter for general adult medical examination without abnormal findings: Secondary | ICD-10-CM | POA: Diagnosis not present

## 2020-06-10 DIAGNOSIS — Z1239 Encounter for other screening for malignant neoplasm of breast: Secondary | ICD-10-CM | POA: Diagnosis not present

## 2020-06-10 DIAGNOSIS — F3342 Major depressive disorder, recurrent, in full remission: Secondary | ICD-10-CM

## 2020-06-10 DIAGNOSIS — H6591 Unspecified nonsuppurative otitis media, right ear: Secondary | ICD-10-CM | POA: Diagnosis not present

## 2020-06-10 DIAGNOSIS — I10 Essential (primary) hypertension: Secondary | ICD-10-CM | POA: Diagnosis not present

## 2020-06-10 DIAGNOSIS — R7309 Other abnormal glucose: Secondary | ICD-10-CM

## 2020-06-10 DIAGNOSIS — E663 Overweight: Secondary | ICD-10-CM | POA: Insufficient documentation

## 2020-06-10 MED ORDER — CITALOPRAM HYDROBROMIDE 20 MG PO TABS: 20.0000 mg | ORAL_TABLET | Freq: Every day | ORAL | 1 refills | Status: DC

## 2020-06-10 MED ORDER — SIMVASTATIN 20 MG PO TABS: 20.0000 mg | ORAL_TABLET | Freq: Every evening | ORAL | 1 refills | Status: DC

## 2020-06-10 MED ORDER — AMLODIPINE BESYLATE 5 MG PO TABS: 5.0000 mg | ORAL_TABLET | Freq: Every day | ORAL | 1 refills | Status: DC

## 2020-06-10 NOTE — Assessment & Plan Note (Signed)
Chronic and well control. Continue current medications and recheck in six months.

## 2020-06-10 NOTE — Assessment & Plan Note (Signed)
Previously well controlled Continue statin Repeat FLP and CMP Goal LDL < 100 

## 2020-06-10 NOTE — Progress Notes (Signed)
I,Laura E Walsh,acting as a Education administrator for [employer], PA-C.,have documented all relevant documentation on the behalf of Mar Daring, PA-C,as directed by  Mar Daring, PA-C while in the presence of Mar Daring, [place].  Complete physical exam   Patient: Sharon Parker   DOB: [birth year]   64 y.o. Female  MRN: [id number] Visit Date: 06/10/2020  Today's healthcare provider: Mar Daring, PA-C   Chief Complaint  Patient presents with  . Annual Exam  . Dizziness   Subjective    Sharon Parker is a 64 y.o. female who presents today for a complete physical exam.  She reports consuming a general diet. Exercises regularly She generally feels well. She reports sleeping well. She does have additional problems to discuss today.   Dizziness This is a new problem. The current episode started in the past 7 days. The problem occurs every several days (Pt reports having three episodes of dizziness in the last week.  She states it lasts for about 3-4 minutes. ). Pertinent negatives include no congestion, headaches, nausea, numbness or weakness. Nothing aggravates the symptoms. She has tried rest for the symptoms.      Past Medical History:  Diagnosis Date  . Allergy   . Hyperlipidemia    Past Surgical History:  Procedure Laterality Date  . CESAREAN SECTION  1994  . COLONOSCOPY  2009  . COLONOSCOPY WITH PROPOFOL N/A 04/30/2018   Procedure: COLONOSCOPY WITH PROPOFOL;  Surgeon: Robert Bellow, MD;  Location: [facility] ENDOSCOPY;  Service: Endoscopy;  Laterality: N/A;  . HERNIA REPAIR    . NASAL SEPTUM SURGERY  1983  . NECK SURGERY  2002   disc in neck replaced C5-C6  . TONSILLECTOMY    . TONSILLECTOMY AND ADENOIDECTOMY  1970   Social History   Socioeconomic History  . Marital status: Married    Spouse name: Not on file  . Number of children: Not on file  . Years of education: Not on file  . Highest education level: Not on file  Occupational  History  . Not on file  Tobacco Use  . Smoking status: Former Smoker    Packs/day: 1.00    Years: 25.00    Pack years: 25.00    Types: Cigarettes    Quit date: 11/19/2011    Years since quitting: 8.5  . Smokeless tobacco: Never Used  Vaping Use  . Vaping Use: Never used  Substance and Sexual Activity  . Alcohol use: No  . Drug use: No  . Sexual activity: Not Currently    Birth control/protection: None  Other Topics Concern  . Not on file  Social History Narrative  . Not on file   Social Determinants of Health   Financial Resource Strain:   . Difficulty of Paying Living Expenses:   Food Insecurity:   . Worried About Charity fundraiser in the Last Year:   . Arboriculturist in the Last Year:   Transportation Needs:   . Film/video editor (Medical):   Marland Kitchen Lack of Transportation (Non-Medical):   Physical Activity:   . Days of Exercise per Week:   . Minutes of Exercise per Session:   Stress:   . Feeling of Stress :   Social Connections:   . Frequency of Communication with Friends and Family:   . Frequency of Social Gatherings with Friends and Family:   . Attends Religious Services:   . Active Member of Clubs or Organizations:   .  Attends Archivist Meetings:   Marland Kitchen Marital Status:   Intimate Partner Violence:   . Fear of Current or Ex-Partner:   . Emotionally Abused:   Marland Kitchen Physically Abused:   . Sexually Abused:    Family Status  Relation Name Status  . Mother  Alive  . Father  Deceased       lung cancer Nicotine dependence  . Sister  Deceased  . Brother  Deceased  . Sister  Alive  . Sister  Alive  . Sister  Alive  . Brother  Alive   Family History  Problem Relation Age of Onset  . Stroke Mother   . Heart disease Mother        CHF  . Lung cancer Father   . Diabetes Sister   . Obesity Sister   . Heart attack Brother   . Healthy Sister   . Healthy Sister   . Healthy Sister   . Healthy Brother    No Known Allergies  Patient Care  Team: Rubye Beach as PCP - General (Family Medicine)   Medications: Outpatient Medications Prior to Visit  Medication Sig  . fexofenadine (ALLEGRA) 180 MG tablet Take 180 mg by mouth daily.  . fluticasone (FLONASE) 50 MCG/ACT nasal spray Place 2 sprays into both nostrils daily.  . Multiple Vitamin (MULTIVITAMINS PO) Take by mouth.  . [DISCONTINUED] citalopram (CELEXA) 20 MG tablet TAKE 1 TABLET(20 MG) BY MOUTH DAILY  . [DISCONTINUED] simvastatin (ZOCOR) 20 MG tablet TAKE 1 TABLET(20 MG) BY MOUTH DAILY  . [DISCONTINUED] mupirocin ointment (BACTROBAN) 2 % Place 1 application into the nose 2 (two) times daily. (Patient not taking: Reported on 05/16/2020)  . [DISCONTINUED] predniSONE (STERAPRED UNI-PAK 21 TAB) 10 MG (21) TBPK tablet 6 day taper; take as directed on package instructions   No facility-administered medications prior to visit.    Review of Systems  Constitutional: Negative.   HENT: Positive for sinus pressure and sinus pain. Negative for congestion.   Eyes: Negative.   Respiratory: Negative.   Cardiovascular: Negative.   Gastrointestinal: Negative.  Negative for nausea.  Endocrine: Negative.   Genitourinary: Negative.   Musculoskeletal: Negative.   Skin: Negative.   Allergic/Immunologic: Negative.   Neurological: Positive for dizziness. Negative for tremors, seizures, syncope, facial asymmetry, speech difficulty, weakness, light-headedness, numbness and headaches.  Hematological: Negative.   Psychiatric/Behavioral: Negative.     Last CBC Lab Results  Component Value Date   WBC 4.9 06/10/2020   HGB 12.9 06/10/2020   HCT 38.6 06/10/2020   MCV 93 06/10/2020   MCH 31.0 06/10/2020   RDW 12.6 06/10/2020   PLT 246 07/62/2633   Last metabolic panel Lab Results  Component Value Date   GLUCOSE 99 06/10/2020   NA 142 06/10/2020   K 4.0 06/10/2020   CL 100 06/10/2020   CO2 23 06/10/2020   BUN 12 06/10/2020   CREATININE 0.68 06/10/2020   GFRNONAA 93  06/10/2020   GFRAA 107 06/10/2020   CALCIUM 9.0 06/10/2020   PROT 6.7 06/10/2020   ALBUMIN 4.4 06/10/2020   LABGLOB 2.3 06/10/2020   AGRATIO 1.9 06/10/2020   BILITOT 0.3 06/10/2020   ALKPHOS 98 06/10/2020   AST 16 06/10/2020   ALT 15 06/10/2020      Objective    BP (!) 183/96 (BP Location: Left Arm, Patient Position: Sitting, Cuff Size: Normal)   Pulse 66   Temp (!) 96.8 F (36 C) (Temporal)   Ht 5\' 6"  (1.676 m)  Wt 198 lb (89.8 kg)   BMI 31.96 kg/m  BP Readings from Last 3 Encounters:  06/10/20 (!) 183/96  05/16/20 (!) 171/87  04/14/19 (!) 154/92   Wt Readings from Last 3 Encounters:  06/10/20 198 lb (89.8 kg)  05/16/20 200 lb 6.4 oz (90.9 kg)  04/14/19 205 lb 6.4 oz (93.2 kg)      Physical Exam Vitals and nursing note reviewed.  Constitutional:      Appearance: Normal appearance.  HENT:     Head: Normocephalic and atraumatic.     Right Ear: Ear canal and external ear normal. A middle ear effusion is present.     Left Ear: Hearing, tympanic membrane, ear canal and external ear normal.     Mouth/Throat:     Comments: Tenderness over right TMJ Eyes:     Extraocular Movements: Extraocular movements intact.     Conjunctiva/sclera: Conjunctivae normal.     Pupils: Pupils are equal, round, and reactive to light.  Neck:     Thyroid: No thyroid mass, thyromegaly or thyroid tenderness.     Vascular: No carotid bruit.  Cardiovascular:     Rate and Rhythm: Normal rate and regular rhythm.     Pulses: Normal pulses.     Heart sounds: Normal heart sounds.  Pulmonary:     Effort: Pulmonary effort is normal.     Breath sounds: Normal breath sounds.  Abdominal:     General: Bowel sounds are normal.     Palpations: Abdomen is soft.     Tenderness: There is no abdominal tenderness.  Musculoskeletal:     Cervical back: No tenderness.     Right lower leg: No edema.     Left lower leg: No edema.  Lymphadenopathy:     Cervical: No cervical adenopathy.  Skin:     General: Skin is warm and dry.  Neurological:     Mental Status: She is alert and oriented to person, place, and time. Mental status is at baseline.  Psychiatric:        Mood and Affect: Mood normal.        Behavior: Behavior normal.        Thought Content: Thought content normal.        Judgment: Judgment normal.       Last depression screening scores PHQ 2/9 Scores 06/10/2020 06/10/2020 05/16/2020  PHQ - 2 Score 0 0 0  PHQ- 9 Score 1 0 6   Last fall risk screening Fall Risk  06/10/2020  Falls in the past year? 0  Number falls in past yr: 0  Injury with Fall? 0  Follow up Falls evaluation completed   Last Audit-C alcohol use screening Alcohol Use Disorder Test (AUDIT) 06/10/2020  1. How often do you have a drink containing alcohol? 2  2. How many drinks containing alcohol do you have on a typical day when you are drinking? 1  3. How often do you have six or more drinks on one occasion? 0  AUDIT-C Score 3   A score of 3 or more in women, and 4 or more in men indicates increased risk for alcohol abuse, EXCEPT if all of the points are from question 1   Results for orders placed or performed in visit on 06/10/20  CBC with Differential/Platelet  Result Value Ref Range   WBC 4.9 3.4 - 10.8 x10E3/uL   RBC 4.16 3.77 - 5.28 x10E6/uL   Hemoglobin 12.9 11.1 - 15.9 g/dL   Hematocrit 38.6 34.0 -  46.6 %   MCV 93 79 - 97 fL   MCH 31.0 26.6 - 33.0 pg   MCHC 33.4 31 - 35 g/dL   RDW 12.6 11.7 - 15.4 %   Platelets 246 150 - 450 x10E3/uL   Neutrophils 53 Not Estab. %   Lymphs 33 Not Estab. %   Monocytes 8 Not Estab. %   Eos 4 Not Estab. %   Basos 2 Not Estab. %   Neutrophils Absolute 2.7 1 - 7 x10E3/uL   Lymphocytes Absolute 1.6 0 - 3 x10E3/uL   Monocytes Absolute 0.4 0 - 0 x10E3/uL   EOS (ABSOLUTE) 0.2 0.0 - 0.4 x10E3/uL   Basophils Absolute 0.1 0 - 0 x10E3/uL   Immature Granulocytes 0 Not Estab. %   Immature Grans (Abs) 0.0 0.0 - 0.1 x10E3/uL  Comprehensive metabolic panel  Result  Value Ref Range   Glucose 99 65 - 99 mg/dL   BUN 12 8 - 27 mg/dL   Creatinine, Ser 0.68 0.57 - 1.00 mg/dL   GFR calc non Af Amer 93 >59 mL/min/1.73   GFR calc Af Amer 107 >59 mL/min/1.73   BUN/Creatinine Ratio 18 12 - 28   Sodium 142 134 - 144 mmol/L   Potassium 4.0 3.5 - 5.2 mmol/L   Chloride 100 96 - 106 mmol/L   CO2 23 20 - 29 mmol/L   Calcium 9.0 8.7 - 10.3 mg/dL   Total Protein 6.7 6.0 - 8.5 g/dL   Albumin 4.4 3.8 - 4.8 g/dL   Globulin, Total 2.3 1.5 - 4.5 g/dL   Albumin/Globulin Ratio 1.9 1.2 - 2.2   Bilirubin Total 0.3 0.0 - 1.2 mg/dL   Alkaline Phosphatase 98 48 - 121 IU/L   AST 16 0 - 40 IU/L   ALT 15 0 - 32 IU/L  Hemoglobin A1c  Result Value Ref Range   Hgb A1c MFr Bld 5.5 4.8 - 5.6 %   Est. average glucose Bld gHb Est-mCnc 111 mg/dL  TSH  Result Value Ref Range   TSH 1.860 0.450 - 4.500 uIU/mL  Lipid panel  Result Value Ref Range   Cholesterol, Total 235 (H) 100 - 199 mg/dL   Triglycerides 205 (H) 0 - 149 mg/dL   HDL 54 >39 mg/dL   VLDL Cholesterol Cal 37 5 - 40 mg/dL   LDL Chol Calc (NIH) 144 (H) 0 - 99 mg/dL   Chol/HDL Ratio 4.4 0.0 - 4.4 ratio    Assessment & Plan    Routine Health Maintenance and Physical Exam  Exercise Activities and Dietary recommendations Goals   None     Immunization History  Administered Date(s) Administered  . Influenza Split 08/24/2011, 09/20/2012  . Influenza,inj,Quad PF,6+ Mos 09/19/2013, 09/02/2014, 09/10/2015, 08/25/2016, 08/24/2017, 08/12/2018, 08/15/2019  . Influenza-Unspecified 08/24/2017, 08/12/2018  . Td 08/24/2011  . Tdap 08/24/2011  . Zoster 08/24/2011    Health Maintenance  Topic Date Due  . COVID-19 Vaccine (1) Never done  . INFLUENZA VACCINE  06/19/2020  . PAP SMEAR-Modifier  03/03/2021  . MAMMOGRAM  07/20/2021  . TETANUS/TDAP  08/23/2021  . COLONOSCOPY  04/30/2028  . Hepatitis C Screening  Completed  . HIV Screening  Completed    Discussed health benefits of physical activity, and encouraged her  to engage in regular exercise appropriate for her age and condition.  Problem List Items Addressed This Visit      Cardiovascular and Mediastinum   Essential hypertension    Suspect the cause of dizziness.  BP has been elevated  the last several visits. BP Readings from Last 3 Encounters:  06/10/20 (!) 183/96  05/16/20 (!) 171/87  04/14/19 (!) 154/92   Will start Amlodipine 5mg  daily. Discussed possible side effects such as leg swelling.           Relevant Medications   amLODipine (NORVASC) 5 MG tablet   Other Relevant Orders   CBC with Differential/Platelet (Completed)     Endocrine   Subclinical hypothyroidism   Relevant Orders   CBC with Differential/Platelet (Completed)   TSH (Completed)     Other   Hypercholesteremia    Previously well controlled Continue statin Repeat FLP and CMP Goal LDL < 100       Relevant Medications   amLODipine (NORVASC) 5 MG tablet   Other Relevant Orders   CBC with Differential/Platelet (Completed)   Comprehensive metabolic panel (Completed)   Lipid panel (Completed)   History of smoking 25-50 pack years    Quit 2012 Discussed lung cancer screening.  Pt states she will think about it and call back if she decides to proceed with the screening.       Abnormal blood sugar    Will recheck labs today. Discussed working on lifestyle changes such as diet and exercise.        Relevant Orders   Comprehensive metabolic panel (Completed)   Hemoglobin A1c (Completed)   Depression    Chronic and well control. Continue current medications and recheck in six months.       Relevant Medications   citalopram (CELEXA) 20 MG tablet   Annual physical exam - Primary   Relevant Orders   CBC with Differential/Platelet (Completed)   Comprehensive metabolic panel (Completed)   Hemoglobin A1c (Completed)   TSH (Completed)   Lipid panel (Completed)   Encounter for breast cancer screening using non-mammogram modality   Class 1 obesity due  to excess calories with serious comorbidity and body mass index (BMI) of 31.0 to 31.9 in adult    Discussed importance of healthy weight management Discussed diet and exercise       Fluid level behind tympanic membrane of right ear    Other Visit Diagnoses    Depression, recurrent (HCC)       Relevant Medications   citalopram (CELEXA) 20 MG tablet      No follow-ups on file.     Reynolds Bowl, PA-C, have reviewed all documentation for this visit. The documentation on 06/14/20 for the exam, diagnosis, procedures, and orders are all accurate and complete.   Rubye Beach  [facility] [phone] (phone) [phone] (fax)  [place]

## 2020-06-10 NOTE — Assessment & Plan Note (Signed)
Suspect the cause of dizziness.  BP has been elevated the last several visits. BP Readings from Last 3 Encounters:  06/10/20 (!) 183/96  05/16/20 (!) 171/87  04/14/19 (!) 154/92   Will start Amlodipine 5mg  daily. Discussed possible side effects such as leg swelling.

## 2020-06-10 NOTE — Assessment & Plan Note (Signed)
Will recheck labs today. Discussed working on lifestyle changes such as diet and exercise.

## 2020-06-10 NOTE — Assessment & Plan Note (Signed)
Discussed importance of healthy weight management Discussed diet and exercise  

## 2020-06-10 NOTE — Patient Instructions (Addendum)
Can use OTC Sudafed for three days to help with ear congestion.    Hypertension, Adult Hypertension is another name for high blood pressure. High blood pressure forces your heart to work harder to pump blood. This can cause problems over time. There are two numbers in a blood pressure reading. There is a top number (systolic) over a bottom number (diastolic). It is best to have a blood pressure that is below 120/80. Healthy choices can help lower your blood pressure, or you may need medicine to help lower it. What are the causes? The cause of this condition is not known. Some conditions may be related to high blood pressure. What increases the risk?  Smoking.  Having type 2 diabetes mellitus, high cholesterol, or both.  Not getting enough exercise or physical activity.  Being overweight.  Having too much fat, sugar, calories, or salt (sodium) in your diet.  Drinking too much alcohol.  Having long-term (chronic) kidney disease.  Having a family history of high blood pressure.  Age. Risk increases with age.  Race. You may be at higher risk if you are African American.  Gender. Men are at higher risk than women before age 81. After age 61, women are at higher risk than men.  Having obstructive sleep apnea.  Stress. What are the signs or symptoms?  High blood pressure may not cause symptoms. Very high blood pressure (hypertensive crisis) may cause: ? Headache. ? Feelings of worry or nervousness (anxiety). ? Shortness of breath. ? Nosebleed. ? A feeling of being sick to your stomach (nausea). ? Throwing up (vomiting). ? Changes in how you see. ? Very bad chest pain. ? Seizures. How is this treated?  This condition is treated by making healthy lifestyle changes, such as: ? Eating healthy foods. ? Exercising more. ? Drinking less alcohol.  Your health care provider may prescribe medicine if lifestyle changes are not enough to get your blood pressure under control, and  if: ? Your top number is above 130. ? Your bottom number is above 80.  Your personal target blood pressure may vary. Follow these instructions at home: Eating and drinking   If told, follow the DASH eating plan. To follow this plan: ? Fill one half of your plate at each meal with fruits and vegetables. ? Fill one fourth of your plate at each meal with whole grains. Whole grains include whole-wheat pasta, brown rice, and whole-grain bread. ? Eat or drink low-fat dairy products, such as skim milk or low-fat yogurt. ? Fill one fourth of your plate at each meal with low-fat (lean) proteins. Low-fat proteins include fish, chicken without skin, eggs, beans, and tofu. ? Avoid fatty meat, cured and processed meat, or chicken with skin. ? Avoid pre-made or processed food.  Eat less than 1,500 mg of salt each day.  Do not drink alcohol if: ? Your doctor tells you not to drink. ? You are pregnant, may be pregnant, or are planning to become pregnant.  If you drink alcohol: ? Limit how much you use to:  0-1 drink a day for women.  0-2 drinks a day for men. ? Be aware of how much alcohol is in your drink. In the [place] one drink equals one 12 oz bottle of beer (355 mL), one 5 oz glass of wine (148 mL), or one 1 oz glass of hard liquor (44 mL). Lifestyle   Work with your doctor to stay at a healthy weight or to lose weight. Ask your doctor what  the best weight is for you.  Get at least 30 minutes of exercise most days of the week. This may include walking, swimming, or biking.  Get at least 30 minutes of exercise that strengthens your muscles (resistance exercise) at least 3 days a week. This may include lifting weights or doing Pilates.  Do not use any products that contain nicotine or tobacco, such as cigarettes, e-cigarettes, and chewing tobacco. If you need help quitting, ask your doctor.  Check your blood pressure at home as told by your doctor.  Keep all follow-up visits as told by  your doctor. This is important. Medicines  Take over-the-counter and prescription medicines only as told by your doctor. Follow directions carefully.  Do not skip doses of blood pressure medicine. The medicine does not work as well if you skip doses. Skipping doses also puts you at risk for problems.  Ask your doctor about side effects or reactions to medicines that you should watch for. Contact a doctor if you:  Think you are having a reaction to the medicine you are taking.  Have headaches that keep coming back (recurring).  Feel dizzy.  Have swelling in your ankles.  Have trouble with your vision. Get help right away if you:  Get a very bad headache.  Start to feel mixed up (confused).  Feel weak or numb.  Feel faint.  Have very bad pain in your: ? Chest. ? Belly (abdomen).  Throw up more than once.  Have trouble breathing. Summary  Hypertension is another name for high blood pressure.  High blood pressure forces your heart to work harder to pump blood.  For most people, a normal blood pressure is less than 120/80.  Making healthy choices can help lower blood pressure. If your blood pressure does not get lower with healthy choices, you may need to take medicine. This information is not intended to replace advice given to you by your health care provider. Make sure you discuss any questions you have with your health care provider. Document Revised: 07/16/2018 Document Reviewed: 07/16/2018 Elsevier Patient Education  [place]. Amlodipine; Atorvastatin oral tablets What is this medicine? AMLODIPINE; ATORVASTATIN (am LOE di peen; a TORE va sta tin) is a combination of 2 drugs. Amlodipine is a calcium-channel blocker used to lower high blood pressure. It also relieves chest pain caused by angina. Atorvastatin blocks the body's ability to make cholesterol. It can help lower blood cholesterol for patients who are at risk of getting heart disease or a stroke. It  is only for patients whose cholesterol level is not controlled by diet. This medicine may be used for other purposes; ask your health care provider or pharmacist if you have questions. COMMON BRAND NAME(S): Caduet What should I tell my health care provider before I take this medicine? They need to know if you have any of these conditions:  an alcohol problem  heart problems, including heart failure or aortic stenosis  hormone disorder like diabetes or under-active thyroid  infection  kidney or liver disease  low blood pressure  other medical condition  recent surgery  seizures (convulsions)  severe injury  an unusual or allergic reaction to Amlodipine; Atorvastatin, medicines, foods, dyes, or preservatives  pregnant or trying to get pregnant  breast-feeding How should I use this medicine? Take this medicine by mouth with a glass of water. Follow the directions on the prescription label. You can take the tablets with or without food. Do not change the amount of grapefruit juice  you drink from day to day while taking this drug, or avoid grapefruit juice altogether. Take your doses at regular intervals. Do not take your medicine more often then directed. Do not suddenly stop taking this medicine. Ask your doctor or health care professional how you can gradually reduce the dose. Talk to your pediatrician regarding the use of this medicine in children. Special care may be needed. Overdosage: If you think you have taken too much of this medicine contact a poison control center or emergency room at once. NOTE: This medicine is only for you. Do not share this medicine with others. What if I miss a dose? If you miss a dose, take it as soon as you can. If it is almost time for your next dose, take only that dose. Do not take double or extra doses. What may interact with this medicine? Do not take this medicine with any of the following medications:  other cholesterol medicines known as  statins like fluvastatin, lovastatin, pravastatin, and simvastatin  red yeast rice  telaprevir  telithromycin  voriconazole This medicine may also interact with the following medications:  antiviral medicines for HIV or AIDS  boceprevir  certain medicines for cholesterol like clofibrate, fenofibrate, and gemfibrozil  cyclosporine  digoxin  female hormones, like estrogens or progestins and birth control pills  grapefruit juice  medicines for fungal infections like fluconazole, itraconazole, ketoconazole, voriconazole  medicines for high blood pressure  niacin  rifampin  some antibiotics like clarithromycin, erythromycin, and troleandomycin This list may not describe all possible interactions. Give your health care provider a list of all the medicines, herbs, non-prescription drugs, or dietary supplements you use. Also tell them if you smoke, drink alcohol, or use illegal drugs. Some items may interact with your medicine. What should I watch for while using this medicine? Visit your doctor or health care professional for regular checks on your progress. You will need to have regular tests to make sure your liver is working properly. Tell your doctor or health care professional as soon as you can if you get any unexplained muscle pain, tenderness, or weakness, especially if you also have a fever and tiredness. Your doctor or health care professional may tell you to stop taking this medicine if you develop muscle problems. If your muscle problems do not go away after stopping this medicine, contact your health care professional. This medicine contains a cholesterol-lowering agent but is only part of a total cholesterol-lowering program. Your physician or dietitian can suggest a low-cholesterol and low-fat diet that will reduce your risk of getting heart and blood vessel disease. Avoid alcohol and smoking, and keep a proper exercise schedule. Check your blood pressure and pulse rate  regularly. Ask your doctor or health care professional what your blood pressure and pulse rate should be and when you should contact him or her. This medicine may affect blood sugar levels. If you have diabetes, check with your doctor or health care professional before you change your diet or the dose of your diabetic medicine. You may feel dizzy or lightheaded. Do not drive, use machinery, or do anything that needs mental alertness until you know how this medicine affects you. To reduce the risk of dizzy or fainting spells, do not sit or stand up quickly, especially if you are an older patient. Avoid alcoholic drinks. They can make you more dizzy, increase flushing and rapid heartbeats. This medicine may cause a decrease in Co-Enzyme Q-10. You should make sure that you get enough Co-Enzyme  Q-10 while you are taking this medicine. Discuss the foods you eat and the vitamins you take with your health care professional. What side effects may I notice from receiving this medicine? Side effects that you should report to your doctor or health care professional as soon as possible:  allergic reactions like skin rash, itching or hives, swelling of the face, lips, or tongue  chest pain  dark urine  fast, irregular heartbeat  feeling faint or lightheaded, falls  fever  muscle pain, weakness  redness, blistering, peeling or loosening of the skin, including inside the mouth  swelling of ankles, legs  trouble passing urine or change in the amount of urine  unusually weak or tired  yellowing of the eyes or skin Side effects that usually do not require medical attention (report to your doctor or health care professional if they continue or are bothersome):  diarrhea  facial flushing  gas  headache  nausea, vomiting  stomach upset or pain This list may not describe all possible side effects. Call your doctor for medical advice about side effects. You may report side effects to FDA at  1-800-FDA-1088. Where should I keep my medicine? Keep out of the reach of children. Store at room temperature between 15 and 30 degrees C (59 and 86 degrees F). Throw away any unused medicine after the expiration date. NOTE: This sheet is a summary. It may not cover all possible information. If you have questions about this medicine, talk to your doctor, pharmacist, or health care provider.  2020 Elsevier/Gold Standard (2017-06-19 09:58:24) Preventive Care 53-58 Years Old, Female Preventive care refers to visits with your health care provider and lifestyle choices that can promote health and wellness. This includes:  A yearly physical exam. This may also be called an annual well check.  Regular dental visits and eye exams.  Immunizations.  Screening for certain conditions.  Healthy lifestyle choices, such as eating a healthy diet, getting regular exercise, not using drugs or products that contain nicotine and tobacco, and limiting alcohol use. What can I expect for my preventive care visit? Physical exam Your health care provider will check your:  Height and weight. This may be used to calculate body mass index (BMI), which tells if you are at a healthy weight.  Heart rate and blood pressure.  Skin for abnormal spots. Counseling Your health care provider may ask you questions about your:  Alcohol, tobacco, and drug use.  Emotional well-being.  Home and relationship well-being.  Sexual activity.  Eating habits.  Work and work Statistician.  Method of birth control.  Menstrual cycle.  Pregnancy history. What immunizations do I need?  Influenza (flu) vaccine  This is recommended every year. Tetanus, diphtheria, and pertussis (Tdap) vaccine  You may need a Td booster every 10 years. Varicella (chickenpox) vaccine  You may need this if you have not been vaccinated. Zoster (shingles) vaccine  You may need this after age 78. Measles, mumps, and rubella (MMR)  vaccine  You may need at least one dose of MMR if you were born in 1957 or later. You may also need a second dose. Pneumococcal conjugate (PCV13) vaccine  You may need this if you have certain conditions and were not previously vaccinated. Pneumococcal polysaccharide (PPSV23) vaccine  You may need one or two doses if you smoke cigarettes or if you have certain conditions. Meningococcal conjugate (MenACWY) vaccine  You may need this if you have certain conditions. Hepatitis A vaccine  You may need this  if you have certain conditions or if you travel or work in places where you may be exposed to hepatitis A. Hepatitis B vaccine  You may need this if you have certain conditions or if you travel or work in places where you may be exposed to hepatitis B. Haemophilus influenzae type b (Hib) vaccine  You may need this if you have certain conditions. Human papillomavirus (HPV) vaccine  If recommended by your health care provider, you may need three doses over 6 months. You may receive vaccines as individual doses or as more than one vaccine together in one shot (combination vaccines). Talk with your health care provider about the risks and benefits of combination vaccines. What tests do I need? Blood tests  Lipid and cholesterol levels. These may be checked every 5 years, or more frequently if you are over 17 years old.  Hepatitis C test.  Hepatitis B test. Screening  Lung cancer screening. You may have this screening every year starting at age 8 if you have a 30-pack-year history of smoking and currently smoke or have quit within the past 15 years.  Colorectal cancer screening. All adults should have this screening starting at age 50 and continuing until age 104. Your health care provider may recommend screening at age 30 if you are at increased risk. You will have tests every 1-10 years, depending on your results and the type of screening test.  Diabetes screening. This is done by  checking your blood sugar (glucose) after you have not eaten for a while (fasting). You may have this done every 1-3 years.  Mammogram. This may be done every 1-2 years. Talk with your health care provider about when you should start having regular mammograms. This may depend on whether you have a family history of breast cancer.  BRCA-related cancer screening. This may be done if you have a family history of breast, ovarian, tubal, or peritoneal cancers.  Pelvic exam and Pap test. This may be done every 3 years starting at age 24. Starting at age 55, this may be done every 5 years if you have a Pap test in combination with an HPV test. Other tests  Sexually transmitted disease (STD) testing.  Bone density scan. This is done to screen for osteoporosis. You may have this scan if you are at high risk for osteoporosis. Follow these instructions at home: Eating and drinking  Eat a diet that includes fresh fruits and vegetables, whole grains, lean protein, and low-fat dairy.  Take vitamin and mineral supplements as recommended by your health care provider.  Do not drink alcohol if: ? Your health care provider tells you not to drink. ? You are pregnant, may be pregnant, or are planning to become pregnant.  If you drink alcohol: ? Limit how much you have to 0-1 drink a day. ? Be aware of how much alcohol is in your drink. In the [place] one drink equals one 12 oz bottle of beer (355 mL), one 5 oz glass of wine (148 mL), or one 1 oz glass of hard liquor (44 mL). Lifestyle  Take daily care of your teeth and gums.  Stay active. Exercise for at least 30 minutes on 5 or more days each week.  Do not use any products that contain nicotine or tobacco, such as cigarettes, e-cigarettes, and chewing tobacco. If you need help quitting, ask your health care provider.  If you are sexually active, practice safe sex. Use a condom or other form of birth control (  contraception) in order to prevent pregnancy  and STIs (sexually transmitted infections).  If told by your health care provider, take low-dose aspirin daily starting at age 38. What's next?  Visit your health care provider once a year for a well check visit.  Ask your health care provider how often you should have your eyes and teeth checked.  Stay up to date on all vaccines. This information is not intended to replace advice given to you by your health care provider. Make sure you discuss any questions you have with your health care provider. Document Revised: 07/17/2018 Document Reviewed: 07/17/2018 Elsevier Patient Education  2020 [employer].

## 2020-06-10 NOTE — Assessment & Plan Note (Addendum)
Quit 2012 Discussed lung cancer screening.  Pt states she will think about it and call back if she decides to proceed with the screening.

## 2020-06-11 LAB — TSH: TSH: 1.86 u[IU]/mL (ref 0.450–4.500)

## 2020-06-11 LAB — CBC WITH DIFFERENTIAL/PLATELET
Basophils Absolute: 0.1 10*3/uL (ref 0.0–0.2)
Basos: 2 %
EOS (ABSOLUTE): 0.2 10*3/uL (ref 0.0–0.4)
Eos: 4 %
Hematocrit: 38.6 % (ref 34.0–46.6)
Hemoglobin: 12.9 g/dL (ref 11.1–15.9)
Immature Grans (Abs): 0 10*3/uL (ref 0.0–0.1)
Immature Granulocytes: 0 %
Lymphocytes Absolute: 1.6 10*3/uL (ref 0.7–3.1)
Lymphs: 33 %
MCH: 31 pg (ref 26.6–33.0)
MCHC: 33.4 g/dL (ref 31.5–35.7)
MCV: 93 fL (ref 79–97)
Monocytes Absolute: 0.4 10*3/uL (ref 0.1–0.9)
Monocytes: 8 %
Neutrophils Absolute: 2.7 10*3/uL (ref 1.4–7.0)
Neutrophils: 53 %
Platelets: 246 10*3/uL (ref 150–450)
RBC: 4.16 x10E6/uL (ref 3.77–5.28)
RDW: 12.6 % (ref 11.7–15.4)
WBC: 4.9 10*3/uL (ref 3.4–10.8)

## 2020-06-11 LAB — COMPREHENSIVE METABOLIC PANEL
ALT: 15 IU/L (ref 0–32)
AST: 16 IU/L (ref 0–40)
Albumin/Globulin Ratio: 1.9 (ref 1.2–2.2)
Albumin: 4.4 g/dL (ref 3.8–4.8)
Alkaline Phosphatase: 98 IU/L (ref 48–121)
BUN/Creatinine Ratio: 18 (ref 12–28)
BUN: 12 mg/dL (ref 8–27)
Bilirubin Total: 0.3 mg/dL (ref 0.0–1.2)
CO2: 23 mmol/L (ref 20–29)
Calcium: 9 mg/dL (ref 8.7–10.3)
Chloride: 100 mmol/L (ref 96–106)
Creatinine, Ser: 0.68 mg/dL (ref 0.57–1.00)
GFR calc Af Amer: 107 mL/min/{1.73_m2} (ref >59)
GFR calc non Af Amer: 93 mL/min/{1.73_m2} (ref >59)
Globulin, Total: 2.3 g/dL (ref 1.5–4.5)
Glucose: 99 mg/dL (ref 65–99)
Potassium: 4 mmol/L (ref 3.5–5.2)
Sodium: 142 mmol/L (ref 134–144)
Total Protein: 6.7 g/dL (ref 6.0–8.5)

## 2020-06-11 LAB — HEMOGLOBIN A1C
Est. average glucose Bld gHb Est-mCnc: 111 mg/dL
Hgb A1c MFr Bld: 5.5 % (ref 4.8–5.6)

## 2020-06-11 LAB — LIPID PANEL
Chol/HDL Ratio: 4.4 ratio (ref 0.0–4.4)
Cholesterol, Total: 235 mg/dL — ABNORMAL HIGH (ref 100–199)
HDL: 54 mg/dL (ref >39)
LDL Chol Calc (NIH): 144 mg/dL — ABNORMAL HIGH (ref 0–99)
Triglycerides: 205 mg/dL — ABNORMAL HIGH (ref 0–149)
VLDL Cholesterol Cal: 37 mg/dL (ref 5–40)

## 2020-06-13 ENCOUNTER — Other Ambulatory Visit: Payer: Self-pay

## 2020-06-13 ENCOUNTER — Telehealth: Payer: Self-pay

## 2020-06-13 DIAGNOSIS — E7800 Pure hypercholesterolemia, unspecified: Secondary | ICD-10-CM

## 2020-06-13 MED ORDER — SIMVASTATIN 40 MG PO TABS: 40.0000 mg | ORAL_TABLET | Freq: Every evening | ORAL | 3 refills | Status: DC

## 2020-06-13 NOTE — Telephone Encounter (Signed)
Would she be willing to increase simvastatin to 40mg ?  If so we can send that in for her.

## 2020-06-13 NOTE — Telephone Encounter (Signed)
She didn't say no.  I had told her that if she had been taking it regularly you recommended it be increased.  When she said she had been taking it regularly I would let you know.   I have sent in for the 40 mg to the pharmacy

## 2020-06-13 NOTE — Telephone Encounter (Signed)
Patient advised of labs and states she has been taking cholesterol meds regularly.

## 2020-06-13 NOTE — Telephone Encounter (Signed)
-----   Message from Mar Daring, [place] sent at 06/13/2020  9:02 AM EDT ----- Blood count is normal. Kidney and liver function are normal. Sodium, potassium, and calcium are normal. A1c/Sugar are normal. Thyroid is normal. Cholesterol has increased compared to last year. Make sure to be taking simvastatin 20mg  daily. If taking may benefit by increasing dose.

## 2020-06-14 ENCOUNTER — Encounter: Payer: Self-pay | Admitting: Physician Assistant

## 2020-06-16 ENCOUNTER — Ambulatory Visit (INDEPENDENT_AMBULATORY_CARE_PROVIDER_SITE_OTHER): Payer: PRIVATE HEALTH INSURANCE | Admitting: Cardiology

## 2020-06-16 ENCOUNTER — Other Ambulatory Visit: Payer: Self-pay

## 2020-06-16 ENCOUNTER — Encounter: Payer: Self-pay | Admitting: Cardiology

## 2020-06-16 ENCOUNTER — Telehealth: Payer: Self-pay | Admitting: Cardiology

## 2020-06-16 VITALS — BP 148/64 | HR 68 | Ht 66.0 in | Wt 194.5 lb

## 2020-06-16 DIAGNOSIS — R079 Chest pain, unspecified: Secondary | ICD-10-CM | POA: Diagnosis not present

## 2020-06-16 DIAGNOSIS — I10 Essential (primary) hypertension: Secondary | ICD-10-CM | POA: Diagnosis not present

## 2020-06-16 DIAGNOSIS — R072 Precordial pain: Secondary | ICD-10-CM | POA: Diagnosis not present

## 2020-06-16 DIAGNOSIS — E7800 Pure hypercholesterolemia, unspecified: Secondary | ICD-10-CM

## 2020-06-16 MED ORDER — METOPROLOL TARTRATE 100 MG PO TABS
100.0000 mg | ORAL_TABLET | Freq: Once | ORAL | 0 refills | Status: DC
Start: 2020-06-16 — End: 2021-01-20

## 2020-06-16 NOTE — Progress Notes (Signed)
Cardiology Office Note:    Date:  06/16/2020   ID:  Sharon, Parker 08/23/1956, MRN [id number]  PCP:  Margaretann Loveless, PA-C  [facility] Cardiologist:  Debbe Odea, MD  [facility] Electrophysiologist:  None   Referring MD: Suella Grove*   Chief Complaint  Patient presents with  . other    Chest pain no complaints today. Meds reviewed verbally with pt.   Sharon Parker is a 64 y.o. female who is being seen today for the evaluation of chest pain at the request of Margaretann Loveless, P*.   History of Present Illness:    Sharon Parker is a 64 y.o. female with a hx of hypertension, hyperlipidemia, former smoker x40 years who presents due to chest pain.  Patient states having symptoms of chest pain for about 1 week.  Describes symptoms as a dull ache on her left chest sometimes radiating to her left arm.  She rates symptoms as 5 out of 10 in severity, lasting a couple of minutes.  Symptoms over the past week, related with exertion, but she states having occasional feeling of someone squeezing her heart when she walks, causing her to start walking over the past year.  She denies any history of heart disease, denies shortness of breath, edema.  She has a history of hypertension, was recently started on amlodipine by PCP.  Her last cholesterol levels were elevated, simvastatin was increased by PCP to 40 mg.  Past Medical History:  Diagnosis Date  . Allergy   . Hyperlipidemia   . Hypertension     Past Surgical History:  Procedure Laterality Date  . CESAREAN SECTION  1994  . COLONOSCOPY  2009  . COLONOSCOPY WITH PROPOFOL N/A 04/30/2018   Procedure: COLONOSCOPY WITH PROPOFOL;  Surgeon: Earline Mayotte, MD;  Location: [facility] ENDOSCOPY;  Service: Endoscopy;  Laterality: N/A;  . HERNIA REPAIR    . NASAL SEPTUM SURGERY  1983  . NECK SURGERY  2002   disc in neck replaced C5-C6  . TONSILLECTOMY    . TONSILLECTOMY AND ADENOIDECTOMY  1970    Current  Medications: Current Meds  Medication Sig  . amLODipine (NORVASC) 5 MG tablet Take 1 tablet (5 mg total) by mouth daily.  . citalopram (CELEXA) 20 MG tablet Take 1 tablet (20 mg total) by mouth daily.  . fexofenadine (ALLEGRA) 180 MG tablet Take 180 mg by mouth daily.  . fluticasone (FLONASE) 50 MCG/ACT nasal spray Place 2 sprays into both nostrils daily.  . Multiple Vitamin (MULTIVITAMINS PO) Take by mouth.  . simvastatin (ZOCOR) 40 MG tablet Take 1 tablet (40 mg total) by mouth every evening.     Allergies:   Patient has no known allergies.   Social History   Socioeconomic History  . Marital status: Married    Spouse name: Not on file  . Number of children: Not on file  . Years of education: Not on file  . Highest education level: Not on file  Occupational History  . Not on file  Tobacco Use  . Smoking status: Former Smoker    Packs/day: 1.00    Years: 25.00    Pack years: 25.00    Types: Cigarettes    Quit date: 11/19/2011    Years since quitting: 8.5  . Smokeless tobacco: Never Used  Vaping Use  . Vaping Use: Never used  Substance and Sexual Activity  . Alcohol use: No  . Drug use: No  . Sexual activity:  Not Currently    Birth control/protection: None  Other Topics Concern  . Not on file  Social History Narrative  . Not on file   Social Determinants of Health   Financial Resource Strain:   . Difficulty of Paying Living Expenses:   Food Insecurity:   . Worried About Charity fundraiser in the Last Year:   . Arboriculturist in the Last Year:   Transportation Needs:   . Film/video editor (Medical):   Marland Kitchen Lack of Transportation (Non-Medical):   Physical Activity:   . Days of Exercise per Week:   . Minutes of Exercise per Session:   Stress:   . Feeling of Stress :   Social Connections:   . Frequency of Communication with Friends and Family:   . Frequency of Social Gatherings with Friends and Family:   . Attends Religious Services:   . Active Member of  Clubs or Organizations:   . Attends Archivist Meetings:   Marland Kitchen Marital Status:      Family History: The patient's family history includes Diabetes in her sister; Healthy in her brother, sister, sister, and sister; Heart attack in her brother; Heart disease in her mother; Lung cancer in her father; Obesity in her sister; Stroke in her mother.  ROS:   Please see the history of present illness.     All other systems reviewed and are negative.  EKGs/Labs/Other Studies Reviewed:    The following studies were reviewed today:   EKG:  EKG is  ordered today.  The ekg ordered today demonstrates normal sinus rhythm, normal ECG.  Recent Labs: 06/10/2020: ALT 15; BUN 12; Creatinine, Ser 0.68; Hemoglobin 12.9; Platelets 246; Potassium 4.0; Sodium 142; TSH 1.860  Recent Lipid Panel    Component Value Date/Time   CHOL 235 (H) 06/10/2020 0914   TRIG 205 (H) 06/10/2020 0914   HDL 54 06/10/2020 0914   CHOLHDL 4.4 06/10/2020 0914   LDLCALC 144 (H) 06/10/2020 0914    Physical Exam:    VS:  BP (!) 148/64 (BP Location: Right Arm, Patient Position: Sitting, Cuff Size: Normal)   Pulse 68   Ht '5\' 6"'$  (1.676 m)   Wt 194 lb 8 oz (88.2 kg)   SpO2 98%   BMI 31.39 kg/m     Wt Readings from Last 3 Encounters:  06/16/20 194 lb 8 oz (88.2 kg)  06/10/20 198 lb (89.8 kg)  05/16/20 200 lb 6.4 oz (90.9 kg)     GEN:  Well nourished, well developed in no acute distress HEENT: Normal NECK: No JVD; No carotid bruits LYMPHATICS: No lymphadenopathy CARDIAC: RRR, no murmurs, rubs, gallops RESPIRATORY:  Clear to auscultation without rales, wheezing or rhonchi  ABDOMEN: Soft, non-tender, non-distended MUSCULOSKELETAL:  No edema; No deformity  SKIN: Warm and dry NEUROLOGIC:  Alert and oriented x 3 PSYCHIATRIC:  Normal affect   ASSESSMENT:    1. Chest pain of uncertain etiology   2. Essential hypertension   3. Pure hypercholesterolemia   4. Precordial pain    PLAN:    In order of problems  listed above:  1. Patient with history of chest pain, symptoms appear to be atypical although she does note some typical symptoms over the past year while walking.  She has risk factors of hypertension, hyperlipidemia, smoking.  Get echocardiogram to evaluate cardiac function, get coronary CTA to evaluate presence of CAD. 2. Patient with history of hypertension, blood pressure improved from prior.  Recently started on amlodipine.  Continue amlodipine 5 mg daily. 3. Patient with history of hyperlipidemia, last cholesterol levels with LDL not at goal.  Simvastatin increased to 40 mg daily.  Continue for now.  Follow-up after echo and coronary CTA.  Medication Adjustments/Labs and Tests Ordered: Current medicines are reviewed at length with the patient today.  Concerns regarding medicines are outlined above.  Orders Placed This Encounter  Procedures  . CT CORONARY MORPH W/CTA COR W/SCORE W/CA W/CM &/OR WO/CM  . CT CORONARY FRACTIONAL FLOW RESERVE DATA PREP  . CT CORONARY FRACTIONAL FLOW RESERVE FLUID ANALYSIS  . EKG 12-Lead  . ECHOCARDIOGRAM COMPLETE   Meds ordered this encounter  Medications  . metoprolol tartrate (LOPRESSOR) 100 MG tablet    Sig: Take 1 tablet (100 mg total) by mouth once for 1 dose. Take 2 hours prior to your CT scan.    Dispense:  1 tablet    Refill:  0    Patient Instructions  Medication Instructions:  Your physician recommends that you continue on your current medications as directed. Please refer to the Current Medication list given to you today. *If you need a refill on your cardiac medications before your next appointment, please call your pharmacy*   Lab Work: None Ordered If you have labs (blood work) drawn today and your tests are completely normal, you will receive your results only by: Marland Kitchen MyChart Message (if you have MyChart) OR . A paper copy in the mail If you have any lab test that is abnormal or we need to change your treatment, we will call you to  review the results.   Testing/Procedures:  1.  Your physician has requested that you have an echocardiogram. Echocardiography is a painless test that uses sound waves to create images of your heart. It provides your doctor with information about the size and shape of your heart and how well your heart's chambers and valves are working. This procedure takes approximately one hour. There are no restrictions for this procedure.  2.  Your physician has requested that you have cardiac CT. Cardiac computed tomography (CT) is a painless test that uses an x-ray machine to take clear, detailed pictures of your heart.  Your cardiac CT will be scheduled at one of the below locations:   [facility] [address] [place], [place] [address] [phone]  [place] [address] [place], [place] [address] [phone]  If scheduled at [facility], please arrive at the [facility] main entrance of [facility] 30 minutes prior to test start time. Proceed to the [facility] Radiology Department (first floor) to check-in and test prep.  If scheduled at [facility], please arrive 15 mins early for check-in and test prep.  Please follow these instructions carefully (unless otherwise directed):  Hold all erectile dysfunction medications at least 3 days (72 hrs) prior to test.  On the Night Before the Test: . Be sure to Drink plenty of water. . Do not consume any caffeinated/decaffeinated beverages or chocolate 12 hours prior to your test. . Do not take any antihistamines 12 hours prior to your test.  On the Day of the Test: . Drink plenty of water. Do not drink any water within one hour of the test. . Do not eat any food 4 hours prior to the test. . You may take your regular medications prior to the test.  . Take metoprolol (Lopressor) two hours prior to test. .  FEMALES- please wear  underwire-free bra if available        After the Test: . Drink plenty of water. . After receiving IV contrast, you may experience a mild flushed feeling. This is normal. . On occasion, you may experience a mild rash up to 24 hours after the test. This is not dangerous. If this occurs, you can take Benadryl 25 mg and increase your fluid intake. . If you experience trouble breathing, this can be serious. If it is severe call 911 IMMEDIATELY. If it is mild, please call our office. .    Once we have confirmed authorization from your insurance company, we will call you to set up a date and time for your test. Based on how quickly your insurance processes prior authorizations requests, please allow up to 4 weeks to be contacted for scheduling your Cardiac CT appointment. Be advised that routine Cardiac CT appointments could be scheduled as many as 8 weeks after your provider has ordered it.  For non-scheduling related questions, please contact the cardiac imaging nurse navigator should you have any questions/concerns: Marchia Bond, Cardiac Imaging Nurse Navigator Burley Saver, Interim Cardiac Imaging Nurse [place] and Vascular Services Direct Office Dial: [phone]   For scheduling needs, including cancellations and rescheduling, please call Vivien Rota at [phone], option 3.      Follow-Up: At [facility], you and your health needs are our priority.  As part of our continuing mission to provide you with exceptional heart care, we have created designated Provider Care Teams.  These Care Teams include your primary Cardiologist (physician) and Advanced Practice Providers (APPs -  Physician Assistants and Nurse Practitioners) who all work together to provide you with the care you need, when you need it.  We recommend signing up for the patient portal called "MyChart".  Sign up information is provided on this After Visit Summary.  MyChart is used to connect with patients for  Virtual Visits (Telemedicine).  Patients are able to view lab/test results, encounter notes, upcoming appointments, etc.  Non-urgent messages can be sent to your provider as well.   To learn more about what you can do with MyChart, go to [email or web address].    Your next appointment:   Follow up after testing   The format for your next appointment:   In Person  Provider:   Kate Sable, MD   Other Instructions   Echocardiogram An echocardiogram is a procedure that uses painless sound waves (ultrasound) to produce an image of the heart. Images from an echocardiogram can provide important information about:  Signs of coronary artery disease (CAD).  Aneurysm detection. An aneurysm is a weak or damaged part of an artery wall that bulges out from the normal force of blood pumping through the body.  Heart size and shape. Changes in the size or shape of the heart can be associated with certain conditions, including heart failure, aneurysm, and CAD.  Heart muscle function.  Heart valve function.  Signs of a past heart attack.  Fluid buildup around the heart.  Thickening of the heart muscle.  A tumor or infectious growth around the heart valves. Tell a health care provider about:  Any allergies you have.  All medicines you are taking, including vitamins, herbs, eye drops, creams, and over-the-counter medicines.  Any blood disorders you have.  Any surgeries you have had.  Any medical conditions you have.  Whether you are pregnant or may be pregnant. What are the risks? Generally, this is a safe  procedure. However, problems may occur, including:  Allergic reaction to dye (contrast) that may be used during the procedure. What happens before the procedure? No specific preparation is needed. You may eat and drink normally. What happens during the procedure?   An IV tube may be inserted into one of your veins.  You may receive contrast through this tube. A  contrast is an injection that improves the quality of the pictures from your heart.  A gel will be applied to your chest.  A wand-like tool (transducer) will be moved over your chest. The gel will help to transmit the sound waves from the transducer.  The sound waves will harmlessly bounce off of your heart to allow the heart images to be captured in real-time motion. The images will be recorded on a computer. The procedure may vary among health care providers and hospitals. What happens after the procedure?  You may return to your normal, everyday life, including diet, activities, and medicines, unless your health care provider tells you not to do that. Summary  An echocardiogram is a procedure that uses painless sound waves (ultrasound) to produce an image of the heart.  Images from an echocardiogram can provide important information about the size and shape of your heart, heart muscle function, heart valve function, and fluid buildup around your heart.  You do not need to do anything to prepare before this procedure. You may eat and drink normally.  After the echocardiogram is completed, you may return to your normal, everyday life, unless your health care provider tells you not to do that. This information is not intended to replace advice given to you by your health care provider. Make sure you discuss any questions you have with your health care provider. Document Revised: 02/26/2019 Document Reviewed: 12/08/2016 Elsevier Patient Education  2020 [place], Kate Sable, MD  06/16/2020 12:33 PM    [place]

## 2020-06-16 NOTE — Patient Instructions (Signed)
Medication Instructions:  Your physician recommends that you continue on your current medications as directed. Please refer to the Current Medication list given to you today. *If you need a refill on your cardiac medications before your next appointment, please call your pharmacy*   Lab Work: None Ordered If you have labs (blood work) drawn today and your tests are completely normal, you will receive your results only by: Marland Kitchen MyChart Message (if you have MyChart) OR . A paper copy in the mail If you have any lab test that is abnormal or we need to change your treatment, we will call you to review the results.   Testing/Procedures:  1.  Your physician has requested that you have an echocardiogram. Echocardiography is a painless test that uses sound waves to create images of your heart. It provides your doctor with information about the size and shape of your heart and how well your heart's chambers and valves are working. This procedure takes approximately one hour. There are no restrictions for this procedure.  2.  Your physician has requested that you have cardiac CT. Cardiac computed tomography (CT) is a painless test that uses an x-ray machine to take clear, detailed pictures of your heart.  Your cardiac CT will be scheduled at one of the below locations:   [facility] [address] [place], [place] [address] [phone]  [place] [address] [place], [place] [address] [phone]  If scheduled at [facility], please arrive at the [facility] main entrance of [facility] 30 minutes prior to test start time. Proceed to the [facility] Radiology Department (first floor) to check-in and test prep.  If scheduled at [facility], please arrive 15 mins early for check-in and test prep.  Please follow these instructions carefully (unless otherwise directed):  Hold all  erectile dysfunction medications at least 3 days (72 hrs) prior to test.  On the Night Before the Test: . Be sure to Drink plenty of water. . Do not consume any caffeinated/decaffeinated beverages or chocolate 12 hours prior to your test. . Do not take any antihistamines 12 hours prior to your test.  On the Day of the Test: . Drink plenty of water. Do not drink any water within one hour of the test. . Do not eat any food 4 hours prior to the test. . You may take your regular medications prior to the test.  . Take metoprolol (Lopressor) two hours prior to test. . FEMALES- please wear underwire-free bra if available        After the Test: . Drink plenty of water. . After receiving IV contrast, you may experience a mild flushed feeling. This is normal. . On occasion, you may experience a mild rash up to 24 hours after the test. This is not dangerous. If this occurs, you can take Benadryl 25 mg and increase your fluid intake. . If you experience trouble breathing, this can be serious. If it is severe call 911 IMMEDIATELY. If it is mild, please call our office. .    Once we have confirmed authorization from your insurance company, we will call you to set up a date and time for your test. Based on how quickly your insurance processes prior authorizations requests, please allow up to 4 weeks to be contacted for scheduling your Cardiac CT appointment. Be advised that routine Cardiac CT appointments could be scheduled as many as 8 weeks after  your provider has ordered it.  For non-scheduling related questions, please contact the cardiac imaging nurse navigator should you have any questions/concerns: Marchia Bond, Cardiac Imaging Nurse Navigator Burley Saver, Interim Cardiac Imaging Nurse [place] and Vascular Services Direct Office Dial: [phone]   For scheduling needs, including cancellations and rescheduling, please call Vivien Rota at [phone], option 3.       Follow-Up: At [facility], you and your health needs are our priority.  As part of our continuing mission to provide you with exceptional heart care, we have created designated Provider Care Teams.  These Care Teams include your primary Cardiologist (physician) and Advanced Practice Providers (APPs -  Physician Assistants and Nurse Practitioners) who all work together to provide you with the care you need, when you need it.  We recommend signing up for the patient portal called "MyChart".  Sign up information is provided on this After Visit Summary.  MyChart is used to connect with patients for Virtual Visits (Telemedicine).  Patients are able to view lab/test results, encounter notes, upcoming appointments, etc.  Non-urgent messages can be sent to your provider as well.   To learn more about what you can do with MyChart, go to [email or web address].    Your next appointment:   Follow up after testing   The format for your next appointment:   In Person  Provider:   Kate Sable, MD   Other Instructions   Echocardiogram An echocardiogram is a procedure that uses painless sound waves (ultrasound) to produce an image of the heart. Images from an echocardiogram can provide important information about:  Signs of coronary artery disease (CAD).  Aneurysm detection. An aneurysm is a weak or damaged part of an artery wall that bulges out from the normal force of blood pumping through the body.  Heart size and shape. Changes in the size or shape of the heart can be associated with certain conditions, including heart failure, aneurysm, and CAD.  Heart muscle function.  Heart valve function.  Signs of a past heart attack.  Fluid buildup around the heart.  Thickening of the heart muscle.  A tumor or infectious growth around the heart valves. Tell a health care provider about:  Any allergies you have.  All medicines you are taking, including vitamins, herbs, eye  drops, creams, and over-the-counter medicines.  Any blood disorders you have.  Any surgeries you have had.  Any medical conditions you have.  Whether you are pregnant or may be pregnant. What are the risks? Generally, this is a safe procedure. However, problems may occur, including:  Allergic reaction to dye (contrast) that may be used during the procedure. What happens before the procedure? No specific preparation is needed. You may eat and drink normally. What happens during the procedure?   An IV tube may be inserted into one of your veins.  You may receive contrast through this tube. A contrast is an injection that improves the quality of the pictures from your heart.  A gel will be applied to your chest.  A wand-like tool (transducer) will be moved over your chest. The gel will help to transmit the sound waves from the transducer.  The sound waves will harmlessly bounce off of your heart to allow the heart images to be captured in real-time motion. The images will be recorded on a computer. The procedure may vary among health care providers and hospitals. What happens after the procedure?  You may return to your normal, everyday life, including diet,  activities, and medicines, unless your health care provider tells you not to do that. Summary  An echocardiogram is a procedure that uses painless sound waves (ultrasound) to produce an image of the heart.  Images from an echocardiogram can provide important information about the size and shape of your heart, heart muscle function, heart valve function, and fluid buildup around your heart.  You do not need to do anything to prepare before this procedure. You may eat and drink normally.  After the echocardiogram is completed, you may return to your normal, everyday life, unless your health care provider tells you not to do that. This information is not intended to replace advice given to you by your health care provider. Make  sure you discuss any questions you have with your health care provider. Document Revised: 02/26/2019 Document Reviewed: 12/08/2016 Elsevier Patient Education  [place].

## 2020-06-16 NOTE — Addendum Note (Signed)
Addended by: Kavin Leech on: 06/16/2020 03:38 PM   Modules accepted: Orders

## 2020-06-16 NOTE — Telephone Encounter (Signed)
Left a message on patients VM per DPR on file that she can call us when she is ready to reschedule CTA test and Echocardiogram or if she feels that she needs to be seen again. I informed her that Dr. Garen Lah is aware she is cancelling both tests, and he advised we cancel the office visit follow up until the tests can be completed. I advised if she has any further questions or concerns, to give [place] a call back.  We will re-order the testing when patient calls back to do so.

## 2020-06-16 NOTE — Telephone Encounter (Signed)
Patient calling in after visit this morning making office aware she does not want to have the echocardiogram or CT done at this time. Patient states her insurance will not pay for it and will be unable to do these tests until after next March when she has new coverage The Echo has been canceled. The CT order may need to be altered or cancelled Please advise

## 2020-07-14 ENCOUNTER — Other Ambulatory Visit: Payer: PRIVATE HEALTH INSURANCE

## 2020-08-03 ENCOUNTER — Other Ambulatory Visit: Payer: Self-pay | Admitting: Physician Assistant

## 2020-08-11 ENCOUNTER — Ambulatory Visit: Payer: PRIVATE HEALTH INSURANCE | Admitting: Cardiology

## 2020-08-17 ENCOUNTER — Other Ambulatory Visit: Payer: Self-pay | Admitting: Physician Assistant

## 2020-08-17 DIAGNOSIS — Z1231 Encounter for screening mammogram for malignant neoplasm of breast: Secondary | ICD-10-CM

## 2020-09-14 ENCOUNTER — Ambulatory Visit
Admission: RE | Admit: 2020-09-14 | Discharge: 2020-09-14 | Disposition: A | Discharge status: Home / Self Care | Payer: PRIVATE HEALTH INSURANCE | Source: Ambulatory Visit | Attending: Physician Assistant | Admitting: Physician Assistant

## 2020-09-14 DIAGNOSIS — Z1231 Encounter for screening mammogram for malignant neoplasm of breast: Secondary | ICD-10-CM | POA: Diagnosis present

## 2020-09-15 ENCOUNTER — Other Ambulatory Visit: Payer: Self-pay | Admitting: Physician Assistant

## 2020-09-15 DIAGNOSIS — N6489 Other specified disorders of breast: Secondary | ICD-10-CM

## 2020-09-15 DIAGNOSIS — R928 Other abnormal and inconclusive findings on diagnostic imaging of breast: Secondary | ICD-10-CM

## 2020-09-21 ENCOUNTER — Ambulatory Visit
Admission: RE | Admit: 2020-09-21 | Discharge: 2020-09-21 | Disposition: A | Discharge status: Home / Self Care | Payer: PRIVATE HEALTH INSURANCE | Source: Ambulatory Visit | Attending: Physician Assistant | Admitting: Physician Assistant

## 2020-09-21 ENCOUNTER — Other Ambulatory Visit: Payer: Self-pay

## 2020-09-21 DIAGNOSIS — N6489 Other specified disorders of breast: Secondary | ICD-10-CM

## 2020-09-21 DIAGNOSIS — R928 Other abnormal and inconclusive findings on diagnostic imaging of breast: Secondary | ICD-10-CM | POA: Diagnosis not present

## 2020-09-26 LAB — FECAL OCCULT BLOOD, GUAIAC: Fecal Occult Blood: NEGATIVE

## 2020-10-18 ENCOUNTER — Encounter: Payer: Self-pay | Admitting: Physician Assistant

## 2020-10-24 ENCOUNTER — Other Ambulatory Visit: Payer: Self-pay | Admitting: Physician Assistant

## 2020-10-24 MED ORDER — AMLODIPINE BESYLATE 5 MG PO TABS: 5.0000 mg | ORAL_TABLET | Freq: Every day | ORAL | 3 refills | Status: DC

## 2020-11-13 ENCOUNTER — Encounter: Payer: Self-pay | Admitting: Physician Assistant

## 2020-11-14 MED ORDER — FUROSEMIDE 20 MG PO TABS: 20.0000 mg | ORAL_TABLET | Freq: Every day | ORAL | 3 refills | Status: DC | PRN

## 2020-11-22 ENCOUNTER — Other Ambulatory Visit: Payer: Self-pay | Admitting: Physician Assistant

## 2020-11-22 DIAGNOSIS — F339 Major depressive disorder, recurrent, unspecified: Secondary | ICD-10-CM

## 2020-12-08 ENCOUNTER — Encounter: Payer: Self-pay | Admitting: Physician Assistant

## 2020-12-16 ENCOUNTER — Ambulatory Visit: Payer: PRIVATE HEALTH INSURANCE | Admitting: Physician Assistant

## 2021-01-20 ENCOUNTER — Other Ambulatory Visit: Payer: Self-pay

## 2021-01-20 ENCOUNTER — Encounter: Payer: Self-pay | Admitting: Family Medicine

## 2021-01-20 ENCOUNTER — Ambulatory Visit (INDEPENDENT_AMBULATORY_CARE_PROVIDER_SITE_OTHER): Payer: 59 | Admitting: Family Medicine

## 2021-01-20 ENCOUNTER — Ambulatory Visit: Payer: Self-pay | Admitting: *Deleted

## 2021-01-20 VITALS — BP 124/60 | HR 63 | Temp 98.0°F | Resp 16 | Wt 203.5 lb

## 2021-01-20 DIAGNOSIS — R058 Other specified cough: Secondary | ICD-10-CM | POA: Diagnosis not present

## 2021-01-20 DIAGNOSIS — M940 Chondrocostal junction syndrome [Tietze]: Secondary | ICD-10-CM | POA: Diagnosis not present

## 2021-01-20 DIAGNOSIS — J0140 Acute pansinusitis, unspecified: Secondary | ICD-10-CM | POA: Diagnosis not present

## 2021-01-20 MED ORDER — AMOXICILLIN-POT CLAVULANATE 875-125 MG PO TABS: 1.0000 | ORAL_TABLET | Freq: Two times a day (BID) | ORAL | 0 refills | Status: AC

## 2021-01-20 NOTE — Telephone Encounter (Signed)
Patient coming in today at 10:20

## 2021-01-20 NOTE — Telephone Encounter (Signed)
Yes ok to double book Otho visit.  Can she come in so I can listen to her lungs please?  She is out of contagious window for COVID

## 2021-01-20 NOTE — Telephone Encounter (Signed)
Dr B would it be ok to double book the nurse visit on your schedule for today?

## 2021-01-20 NOTE — Progress Notes (Signed)
Established patient visit   Patient: Sharon Parker   DOB: Aug 10, 1956   65 y.o. Female  MRN: [id number] Visit Date: 01/20/2021  Today's healthcare provider: Lavon Paganini, MD   Chief Complaint  Patient presents with  . Cough   Subjective    Cough This is a recurrent problem. The current episode started more than 1 month ago. The problem has been unchanged. The problem occurs constantly. The cough is non-productive. Associated symptoms include nasal congestion, postnasal drip and shortness of breath. Pertinent negatives include no chest pain, chills, ear pain, fever, rhinorrhea, sore throat, weight loss or wheezing. The symptoms are aggravated by lying down and exercise. She has tried nothing for the symptoms.     Patient Active Problem List   Diagnosis Date Noted  . Annual physical exam 06/10/2020  . Encounter for breast cancer screening using non-mammogram modality 06/10/2020  . Class 1 obesity due to excess calories with serious comorbidity and body mass index (BMI) of 31.0 to 31.9 in adult 06/10/2020  . Essential hypertension 06/10/2020  . Fluid level behind tympanic membrane of right ear 06/10/2020  . Recurrent major depressive disorder, in full remission ([place]) 04/14/2019  . Abnormal blood sugar 01/23/2016  . Depression 01/23/2016  . Adaptation reaction 01/20/2016  . Adrenal adenoma 01/20/2016  . Allergic rhinitis 01/20/2016  . Adult BMI 30+ 01/20/2016  . Adiposity 01/20/2016  . Arthritis, degenerative 01/20/2016  . Subclinical hypothyroidism 01/20/2016  . History of smoking 25-50 pack years 01/20/2016  . Hypercholesteremia 05/17/2015   Social History   Tobacco Use  . Smoking status: Former Smoker    Packs/day: 1.00    Years: 25.00    Pack years: 25.00    Types: Cigarettes    Quit date: 11/19/2011    Years since quitting: 9.1  . Smokeless tobacco: Never Used  Vaping Use  . Vaping Use: Never used  Substance Use Topics  . Alcohol use: No  . Drug use:  No   No Known Allergies     Medications: Outpatient Medications Prior to Visit  Medication Sig  . amLODipine (NORVASC) 5 MG tablet Take 1 tablet (5 mg total) by mouth daily.  . fexofenadine (ALLEGRA) 180 MG tablet Take 180 mg by mouth daily.  . fluticasone (FLONASE) 50 MCG/ACT nasal spray Place 2 sprays into both nostrils daily.  . furosemide (LASIX) 20 MG tablet Take 1 tablet (20 mg total) by mouth daily as needed for edema.  . Multiple Vitamin (MULTIVITAMINS PO) Take by mouth.  . simvastatin (ZOCOR) 40 MG tablet Take 1 tablet (40 mg total) by mouth every evening. (Patient taking differently: Take 20 mg by mouth every evening.)  . [DISCONTINUED] citalopram (CELEXA) 20 MG tablet TAKE 1 TABLET(20 MG) BY MOUTH DAILY  . [DISCONTINUED] metoprolol tartrate (LOPRESSOR) 100 MG tablet Take 1 tablet (100 mg total) by mouth once for 1 dose. Take 2 hours prior to your CT scan.   No facility-administered medications prior to visit.    Review of Systems  Constitutional: Negative for chills, fever and weight loss.  HENT: Positive for postnasal drip. Negative for ear pain, rhinorrhea and sore throat.   Respiratory: Positive for cough and shortness of breath. Negative for chest tightness and wheezing.   Cardiovascular: Negative for chest pain.    Last CBC Lab Results  Component Value Date   WBC 4.9 06/10/2020   HGB 12.9 06/10/2020   HCT 38.6 06/10/2020   MCV 93 06/10/2020   MCH 31.0 06/10/2020  RDW 12.6 06/10/2020   PLT 246 02/72/5366   Last metabolic panel Lab Results  Component Value Date   GLUCOSE 99 06/10/2020   NA 142 06/10/2020   K 4.0 06/10/2020   CL 100 06/10/2020   CO2 23 06/10/2020   BUN 12 06/10/2020   CREATININE 0.68 06/10/2020   GFRNONAA 93 06/10/2020   GFRAA 107 06/10/2020   CALCIUM 9.0 06/10/2020   PROT 6.7 06/10/2020   ALBUMIN 4.4 06/10/2020   LABGLOB 2.3 06/10/2020   AGRATIO 1.9 06/10/2020   BILITOT 0.3 06/10/2020   ALKPHOS 98 06/10/2020   AST 16 06/10/2020    ALT 15 06/10/2020   Last lipids Lab Results  Component Value Date   CHOL 235 (H) 06/10/2020   HDL 54 06/10/2020   LDLCALC 144 (H) 06/10/2020   TRIG 205 (H) 06/10/2020   CHOLHDL 4.4 06/10/2020        Objective    BP 124/60 (BP Location: Left Arm, Patient Position: Sitting, Cuff Size: Large)   Pulse 63   Temp 98 F (36.7 C) (Oral)   Resp 16   Wt 203 lb 8 oz (92.3 kg)   SpO2 95%   BMI 32.85 kg/m  BP Readings from Last 3 Encounters:  01/20/21 124/60  06/16/20 (!) 148/64  06/10/20 (!) 183/96   Wt Readings from Last 3 Encounters:  01/20/21 203 lb 8 oz (92.3 kg)  06/16/20 194 lb 8 oz (88.2 kg)  06/10/20 198 lb (89.8 kg)      Physical Exam Vitals reviewed.  Constitutional:      General: She is not in acute distress.    Appearance: Normal appearance. She is well-developed. She is not diaphoretic.  HENT:     Head: Normocephalic and atraumatic.     Right Ear: Tympanic membrane, ear canal and external ear normal.     Left Ear: Tympanic membrane, ear canal and external ear normal.     Nose:     Right Sinus: Maxillary sinus tenderness and frontal sinus tenderness present.     Left Sinus: Maxillary sinus tenderness and frontal sinus tenderness present.  Eyes:     General: No scleral icterus.    Conjunctiva/sclera: Conjunctivae normal.  Neck:     Thyroid: No thyromegaly.  Cardiovascular:     Rate and Rhythm: Normal rate and regular rhythm.     Pulses: Normal pulses.     Heart sounds: Normal heart sounds. No murmur heard.   Pulmonary:     Effort: Pulmonary effort is normal. No respiratory distress.     Breath sounds: Normal breath sounds. No wheezing, rhonchi or rales.  Chest:     Chest wall: Tenderness (L costochondral junction) present.  Musculoskeletal:     Cervical back: Neck supple.     Right lower leg: No edema.     Left lower leg: No edema.  Lymphadenopathy:     Cervical: No cervical adenopathy.  Skin:    General: Skin is warm and dry.     Findings: No  rash.  Neurological:     Mental Status: She is alert and oriented to person, place, and time. Mental status is at baseline.  Psychiatric:        Mood and Affect: Mood normal.        Behavior: Behavior normal.       No results found for any visits on 01/20/21.  Assessment & Plan     1. Acute non-recurrent pansinusitis - symptoms and exam c/w sinusitis   - no  evidence of AOM, CAP, strep pharyngitis, or other infection - given duration of symptoms, suspect bacterial etiology - will treat with Augmentin x7d - discussed symptomatic management (flonase, decongestants, etc), natural course, and return precautions    2. Post-viral cough syndrome - lingering cough after COVID19 infection >1 month ago - benign in nature with normal lung exam - reassurance given - continue antihistamine and Flonase -Use honey for cough  3. Costochondritis -New problem -Likely related to cough in the setting of recent COVID-19 infection -Advised ice, rest, anti-inflammatories   Meds ordered this encounter  Medications  . amoxicillin-clavulanate (AUGMENTIN) 875-125 MG tablet    Sig: Take 1 tablet by mouth 2 (two) times daily for 7 days.    Dispense:  14 tablet    Refill:  0     Return in about 5 months (around 06/22/2021) for CPE.      I, Lavon Paganini, MD, have reviewed all documentation for this visit. The documentation on 01/20/21 for the exam, diagnosis, procedures, and orders are all accurate and complete.   , Dionne Bucy, MD, MPH [facility]

## 2021-01-20 NOTE — Patient Instructions (Signed)
Costochondritis  Costochondritis is irritation and swelling (inflammation) of the tissue that connects the ribs to the breastbone (sternum). This tissue is called cartilage. Costochondritis causes pain in the front of the chest. Usually, the pain:  Starts slowly.  Is in more than one rib. What are the causes? The exact cause of this condition is not always known. It results from stress on the tissue in the affected area. The cause of this stress could be:  Chest injury.  Exercise or activity, such as lifting.  Very bad coughing. What increases the risk? You are more likely to develop this condition if you:  Are female.  Are 30-40 years old.  Recently started a new exercise or work activity.  Have low levels of vitamin D.  Have a condition that makes you cough often. What are the signs or symptoms? The main symptom of this condition is chest pain. The pain:  Usually starts slowly and can be sharp or dull.  Gets worse with deep breathing, coughing, or exercise.  Gets better with rest.  May be worse when you press on the affected area of your ribs and breastbone. How is this treated? This condition usually goes away on its own over time. Your doctor may prescribe an NSAID, such as ibuprofen. This can help reduce pain and inflammation. Treatment may also include:  Resting and avoiding activities that make pain worse.  Putting heat or ice on the painful area.  Doing exercises to stretch your chest muscles. If these treatments do not help, your doctor may inject a numbing medicine to help relieve the pain. Follow these instructions at home: Managing pain, stiffness, and swelling  If told, put ice on the painful area. To do this: ? Put ice in a plastic bag. ? Place a towel between your skin and the bag. ? Leave the ice on for 20 minutes, 2-3 times a day.  If told, put heat on the affected area. Do this as often as told by your doctor. Use the heat source that your  doctor recommends, such as a moist heat pack or a heating pad. ? Place a towel between your skin and the heat source. ? Leave the heat on for 20-30 minutes. ? Take off the heat if your skin turns bright red. This is very important if you cannot feel pain, heat, or cold. You may have a greater risk of getting burned.      Activity  Rest as told by your doctor.  Do not do anything that makes your pain worse. This includes any activities that use chest, belly (abdomen), and side muscles.  Do not lift anything that is heavier than 10 lb (4.5 kg), or the limit that you are told, until your doctor says that it is safe.  Return to your normal activities as told by your doctor. Ask your doctor what activities are safe for you. General instructions  Take over-the-counter and prescription medicines only as told by your doctor.  Keep all follow-up visits as told by your doctor. This is important. Contact a doctor if:  You have chills or a fever.  Your pain does not go away or it gets worse.  You have a cough that does not go away. Get help right away if:  You are short of breath.  You have very bad chest pain that is not helped by medicines, heat, or ice. These symptoms may be an emergency. Do not wait to see if the symptoms will go away. Get   medical help right away. Call your local emergency services (911 in the [place] Do not drive yourself to the hospital. Summary  Costochondritis is irritation and swelling (inflammation) of the tissue that connects the ribs to the breastbone (sternum).  This condition causes pain in the front of the chest.  Treatment may include medicines, rest, heat or ice, and exercises. This information is not intended to replace advice given to you by your health care provider. Make sure you discuss any questions you have with your health care provider. Document Revised: 09/18/2019 Document Reviewed: 09/18/2019 Elsevier Patient Education  2021 [employer] Inc[employer]  

## 2021-01-20 NOTE — Telephone Encounter (Signed)
Pt called in c/o continuing dry hacking cough that is worse at night since having covid at the end of Jan. 2022.   She is also c/o a tightness in the top of her chest when she breaths in.   Not coughing up anything.   Denies shortness of breath.   No fever.   She checked it while on the phone with me.  "This cough and tightness is just not going away".  There were no available appts with any of the providers within the protocol timeframe.   I have sent a high priority note to [facility] to see if they can work her in sooner.   Pt was agreeable to someone calling her back for an appt. She can be reached at [phone].   Reason for Disposition  [1] Continuous (nonstop) coughing AND [2] keeps from working or sleeping  Answer Assessment - Initial Assessment Questions 1. RESPIRATORY STATUS: "Describe your breathing?" (e.g., wheezing, shortness of breath, unable to speak, severe coughing)      Having trouble breathing.  Had covid at the end of Jan.    I feel like I'm not getting enough air.  My sinuses are swollen but no runny nose.   Before I had the cough.   I'm taking my allergy and Flonase but not getting better. 2. ONSET: "When did this breathing problem begin?"      With covid since end of Jan.    The top of my chest feels tight when I breath in.   I'm coughing and I have a headache.   Not coughing up anything.  She checked her temperature while on the phone with me it was 97.1 3. PATTERN "Does the difficult breathing come and go, or has it been constant since it started?"      Constant the last 2 days.   Worse at night  I cough a lot at night 4. SEVERITY: "How bad is your breathing?" (e.g., mild, moderate, severe)    - MILD: No SOB at rest, mild SOB with walking, speaks normally in sentences, can lay down, no retractions, pulse < 100.    - MODERATE: SOB at rest, SOB with minimal exertion and prefers to sit, cannot lie down flat, speaks in phrases, mild retractions, audible  wheezing, pulse 100-120.    - SEVERE: Very SOB at rest, speaks in single words, struggling to breathe, sitting hunched forward, retractions, pulse > 120      No shortness of breath    I'm tired. 5. RECURRENT SYMPTOM: "Have you had difficulty breathing before?" If Yes, ask: "When was the last time?" and "What happened that time?"      No 6. CARDIAC HISTORY: "Do you have any history of heart disease?" (e.g., heart attack, angina, bypass surgery, angioplasty)      No  Just hypertension but on medication and my BP is fine when I check it. 7. LUNG HISTORY: "Do you have any history of lung disease?"  (e.g., pulmonary embolus, asthma, emphysema)     No 8. CAUSE: "What do you think is causing the breathing problem?"      Carry  over from the covid. 9. OTHER SYMPTOMS: "Do you have any other symptoms? (e.g., dizziness, runny nose, cough, chest pain, fever)     Swollen sinuses but no sore throat or runny nose 10. PREGNANCY: "Is there any chance you are pregnant?" "When was your last menstrual period?"       Not asked 11. TRAVEL: "Have you  traveled out of the country in the last month?" (e.g., travel history, exposures)       No  Protocols used: BREATHING DIFFICULTY-A-AH

## 2021-04-21 ENCOUNTER — Telehealth: Payer: Self-pay

## 2021-04-21 DIAGNOSIS — Z1231 Encounter for screening mammogram for malignant neoplasm of breast: Secondary | ICD-10-CM

## 2021-04-21 NOTE — Telephone Encounter (Signed)
Copied from [place] [id number]. Topic: General - Other >> Apr 21, 2021  9:13 AM Alanda Slim E wrote: Reason for CRM: Pt received letter that she needs follow up mammogram for right breast/ Pt was advised by [facility] that an order needs to placed/ please advise when placed

## 2021-04-21 NOTE — Telephone Encounter (Signed)
According to previous diagnostics report patient is to return back to imaging in 6 months, orders have been placed and patient advised. KW

## 2021-05-02 ENCOUNTER — Ambulatory Visit
Admission: RE | Admit: 2021-05-02 | Discharge: 2021-05-02 | Disposition: A | Discharge status: Home / Self Care | Payer: Medicare Other | Source: Ambulatory Visit | Attending: Family Medicine | Admitting: Family Medicine

## 2021-05-02 ENCOUNTER — Other Ambulatory Visit: Payer: Self-pay

## 2021-05-02 DIAGNOSIS — Z1231 Encounter for screening mammogram for malignant neoplasm of breast: Secondary | ICD-10-CM

## 2021-05-02 DIAGNOSIS — R928 Other abnormal and inconclusive findings on diagnostic imaging of breast: Secondary | ICD-10-CM | POA: Insufficient documentation

## 2021-05-02 DIAGNOSIS — R922 Inconclusive mammogram: Secondary | ICD-10-CM | POA: Diagnosis not present

## 2021-06-13 ENCOUNTER — Ambulatory Visit (INDEPENDENT_AMBULATORY_CARE_PROVIDER_SITE_OTHER): Payer: Medicare Other | Admitting: Family Medicine

## 2021-06-13 ENCOUNTER — Encounter: Payer: Self-pay | Admitting: Family Medicine

## 2021-06-13 ENCOUNTER — Other Ambulatory Visit: Payer: Self-pay

## 2021-06-13 DIAGNOSIS — Z0000 Encounter for general adult medical examination without abnormal findings: Secondary | ICD-10-CM

## 2021-06-13 DIAGNOSIS — Z780 Asymptomatic menopausal state: Secondary | ICD-10-CM

## 2021-06-13 DIAGNOSIS — Z23 Encounter for immunization: Secondary | ICD-10-CM | POA: Diagnosis not present

## 2021-06-13 NOTE — Patient Instructions (Addendum)
The CDC recommends two doses of Shingrix (the shingles vaccine) separated by 2 to 6 months for adults age 65 years and older. I recommend checking with your insurance plan regarding coverage for this vaccine.    Try CoQ10 100-200 mg daily for leg pain

## 2021-06-13 NOTE — Progress Notes (Signed)
Medicare Initial Preventative Physical Exam    Patient: Sharon Parker, Female    DOB: Oct 12, 1956, 65 y.o.   MRN: [id number] Visit Date: 06/13/2021  Today's Provider: Lavon Paganini, MD   Chief Complaint  Patient presents with   welcome to medicare    Subjective    Medicare Initial Preventative Physical Exam Sharon Parker is a 65 y.o. female who presents today for her Initial Preventative Physical Exam.   HPI  Social History   Socioeconomic History   Marital status: Married    Spouse name: Not on file   Number of children: Not on file   Years of education: Not on file   Highest education level: Not on file  Occupational History   Not on file  Tobacco Use   Smoking status: Former    Packs/day: 1.00    Years: 25.00    Pack years: 25.00    Types: Cigarettes    Quit date: 11/19/2011    Years since quitting: 9.5   Smokeless tobacco: Never  Vaping Use   Vaping Use: Never used  Substance and Sexual Activity   Alcohol use: No   Drug use: No   Sexual activity: Not Currently    Birth control/protection: None  Other Topics Concern   Not on file  Social History Narrative   Not on file   Social Determinants of Health   Financial Resource Strain: Not on file  Food Insecurity: Not on file  Transportation Needs: Not on file  Physical Activity: Not on file  Stress: Not on file  Social Connections: Not on file  Intimate Partner Violence: Not on file    Past Medical History:  Diagnosis Date   Allergy    Hyperlipidemia    Hypertension      Patient Active Problem List   Diagnosis Date Noted   Encounter for breast cancer screening using non-mammogram modality 06/10/2020   Class 1 obesity due to excess calories with serious comorbidity and body mass index (BMI) of 31.0 to 31.9 in adult 06/10/2020   Essential hypertension 06/10/2020   Fluid level behind tympanic membrane of right ear 06/10/2020   Recurrent major depressive disorder, in full remission  ([place]) 04/14/2019   Abnormal blood sugar 01/23/2016   Depression 01/23/2016   Adaptation reaction 01/20/2016   Adrenal adenoma 01/20/2016   Allergic rhinitis 01/20/2016   Adult BMI 30+ 01/20/2016   Adiposity 01/20/2016   Arthritis, degenerative 01/20/2016   Subclinical hypothyroidism 01/20/2016   History of smoking 25-50 pack years 01/20/2016   Hypercholesteremia 05/17/2015    Past Surgical History:  Procedure Laterality Date   [place]   COLONOSCOPY  2009   COLONOSCOPY WITH PROPOFOL N/A 04/30/2018   Procedure: COLONOSCOPY WITH PROPOFOL;  Surgeon: Robert Bellow, MD;  Location: [facility] ENDOSCOPY;  Service: Endoscopy;  Laterality: N/A;   HERNIA REPAIR     NASAL SEPTUM SURGERY  1983   NECK SURGERY  2002   disc in neck replaced C5-C6   TONSILLECTOMY     TONSILLECTOMY AND ADENOIDECTOMY  1970    Her family history includes Diabetes in her sister; Healthy in her brother, sister, sister, and sister; Heart attack in her brother; Heart disease in her mother; Lung cancer in her father; Obesity in her sister; Stroke in her mother.   Current Outpatient Medications:    amLODipine (NORVASC) 5 MG tablet, Take 1 tablet (5 mg total) by mouth daily., Disp: 90 tablet, Rfl: 3   fexofenadine (ALLEGRA) 180  MG tablet, Take 180 mg by mouth daily., Disp: , Rfl:    fluticasone (FLONASE) 50 MCG/ACT nasal spray, Place 2 sprays into both nostrils daily., Disp: 16 g, Rfl: 10   furosemide (LASIX) 20 MG tablet, Take 1 tablet (20 mg total) by mouth daily as needed for edema., Disp: 30 tablet, Rfl: 3   Multiple Vitamin (MULTIVITAMINS PO), Take by mouth., Disp: , Rfl:    simvastatin (ZOCOR) 40 MG tablet, Take 1 tablet (40 mg total) by mouth every evening. (Patient taking differently: Take 20 mg by mouth every evening.), Disp: 90 tablet, Rfl: 3   Patient Care Team: Virginia Crews, MD as PCP - General (Family Medicine) Kate Sable, MD as PCP - Cardiology (Cardiology)  Review of  Systems  Constitutional: Negative.   HENT: Negative.    Eyes: Negative.   Respiratory: Negative.    Cardiovascular: Negative.   Gastrointestinal: Negative.   Endocrine: Negative.   Genitourinary: Negative.   Musculoskeletal: Negative.   Skin: Negative.   Allergic/Immunologic: Negative.   Neurological: Negative.   Hematological: Negative.   Psychiatric/Behavioral: Negative.       Objective    Vitals: There were no vitals taken for this visit. Vision Screening   Right eye Left eye Both eyes  Without correction     With correction '20/30 20/25 20/30 '$   Physical Exam Vitals reviewed.  Constitutional:      General: She is not in acute distress.    Appearance: Normal appearance. She is well-developed. She is not diaphoretic.  HENT:     Head: Normocephalic and atraumatic.     Right Ear: Tympanic membrane, ear canal and external ear normal.     Left Ear: Tympanic membrane, ear canal and external ear normal.     Nose: Nose normal.     Mouth/Throat:     Mouth: Mucous membranes are moist.     Pharynx: Oropharynx is clear. No oropharyngeal exudate.  Eyes:     General: No scleral icterus.    Conjunctiva/sclera: Conjunctivae normal.     Pupils: Pupils are equal, round, and reactive to light.  Neck:     Thyroid: No thyromegaly.  Cardiovascular:     Rate and Rhythm: Normal rate and regular rhythm.     Pulses: Normal pulses.     Heart sounds: Normal heart sounds. No murmur heard. Pulmonary:     Effort: Pulmonary effort is normal. No respiratory distress.     Breath sounds: Normal breath sounds. No wheezing or rales.  Abdominal:     General: There is no distension.     Palpations: Abdomen is soft.     Tenderness: There is no abdominal tenderness.  Musculoskeletal:        General: No deformity.     Cervical back: Neck supple.     Right lower leg: No edema.     Left lower leg: No edema.  Lymphadenopathy:     Cervical: No cervical adenopathy.  Skin:    General: Skin is warm  and dry.     Findings: No rash.  Neurological:     Mental Status: She is alert and oriented to person, place, and time. Mental status is at baseline.     Sensory: No sensory deficit.     Motor: No weakness.     Gait: Gait normal.  Psychiatric:        Mood and Affect: Mood normal.        Behavior: Behavior normal.  Thought Content: Thought content normal.     Activities of Daily Living In your present state of health, do you have any difficulty performing the following activities: 06/13/2021  Hearing? N  Vision? N  Difficulty concentrating or making decisions? N  Walking or climbing stairs? N  Dressing or bathing? N  Doing errands, shopping? N  Some recent data might be hidden    Fall Risk Assessment Fall Risk  06/13/2021 06/10/2020 05/16/2020 02/24/2018 02/11/2017  Falls in the past year? 0 0 0 No No  Number falls in past yr: 0 0 0 - -  Injury with Fall? 0 0 0 - -  Follow up - Falls evaluation completed Falls evaluation completed - -     Depression Screen PHQ 2/9 Scores 06/13/2021 06/10/2020 06/10/2020 05/16/2020  PHQ - 2 Score 0 0 0 0  PHQ- 9 Score 0 1 0 6    No flowsheet data found.  No results found for any visits on 06/13/21.  Assessment & Plan      Initial Preventative Physical Exam  Reviewed patient's Family Medical History Reviewed and updated list of patient's medical providers Assessment of cognitive impairment was done Assessed patient's functional ability Established a written schedule for health screening [place] Completed and Reviewed  Exercise Activities and Dietary recommendations  Goals   None     Immunization History  Administered Date(s) Administered   Influenza Split 08/24/2011, 09/20/2012   Influenza,inj,Quad PF,6+ Mos 09/19/2013, 09/02/2014, 09/10/2015, 08/25/2016, 08/24/2017, 08/12/2018, 08/15/2019   Influenza-Unspecified 08/24/2017, 08/12/2018, 08/17/2020   PNEUMOCOCCAL CONJUGATE-20 06/13/2021   Td 08/24/2011    Tdap 08/24/2011   Zoster, Live 08/24/2011    Health Maintenance  Topic Date Due   COVID-19 Vaccine (1) Never done   Zoster Vaccines- Shingrix (1 of 2) Never done   DEXA SCAN  Never done   PNA vac Low Risk Adult (1 of 2 - PCV13) 02/12/2021   INFLUENZA VACCINE  06/19/2021   TETANUS/TDAP  08/23/2021   COLON CANCER SCREENING ANNUAL FOBT  09/26/2021   MAMMOGRAM  09/14/2022   PAP SMEAR-Modifier  03/04/2023   COLONOSCOPY (Pts 45-5yr Insurance coverage will need to be confirmed)  04/30/2028   Hepatitis C Screening  Completed   HIV Screening  Completed   HPV VACCINES  Aged Out     Discussed health benefits of physical activity, and encouraged her to engage in regular exercise appropriate for her age and condition.   Problem List Items Addressed This Visit   None Visit Diagnoses     Welcome to Medicare preventive visit    -  Primary   Relevant Orders   EKG 12-Lead (Completed)   Postmenopausal       Relevant Orders   DG Bone Density   Need for pneumococcal vaccination       Relevant Orders   Pneumococcal conjugate vaccine 20-valent (Prevnar 20) (Completed)        Return in about 4 weeks (around 07/11/2021) for chronic disease f/u.     I, ALavon Paganini MD, have reviewed all documentation for this visit. The documentation on 06/13/21 for the exam, diagnosis, procedures, and orders are all accurate and complete.   , ADionne Bucy MD, MPH [facility]

## 2021-06-27 ENCOUNTER — Encounter: Payer: Self-pay | Admitting: Family Medicine

## 2021-06-27 ENCOUNTER — Other Ambulatory Visit: Payer: Self-pay

## 2021-06-27 ENCOUNTER — Ambulatory Visit (INDEPENDENT_AMBULATORY_CARE_PROVIDER_SITE_OTHER): Payer: Medicare Other | Admitting: Family Medicine

## 2021-06-27 VITALS — BP 150/75 | HR 75 | Temp 98.1°F | Resp 16 | Wt 203.6 lb

## 2021-06-27 DIAGNOSIS — D3501 Benign neoplasm of right adrenal gland: Secondary | ICD-10-CM

## 2021-06-27 DIAGNOSIS — I10 Essential (primary) hypertension: Secondary | ICD-10-CM

## 2021-06-27 DIAGNOSIS — E038 Other specified hypothyroidism: Secondary | ICD-10-CM

## 2021-06-27 DIAGNOSIS — E6609 Other obesity due to excess calories: Secondary | ICD-10-CM

## 2021-06-27 DIAGNOSIS — R7309 Other abnormal glucose: Secondary | ICD-10-CM

## 2021-06-27 DIAGNOSIS — Z6831 Body mass index (BMI) 31.0-31.9, adult: Secondary | ICD-10-CM

## 2021-06-27 DIAGNOSIS — E7800 Pure hypercholesterolemia, unspecified: Secondary | ICD-10-CM

## 2021-06-27 DIAGNOSIS — M7741 Metatarsalgia, right foot: Secondary | ICD-10-CM

## 2021-06-27 DIAGNOSIS — T466X5A Adverse effect of antihyperlipidemic and antiarteriosclerotic drugs, initial encounter: Secondary | ICD-10-CM | POA: Diagnosis not present

## 2021-06-27 DIAGNOSIS — M791 Myalgia, unspecified site: Secondary | ICD-10-CM | POA: Diagnosis not present

## 2021-06-27 NOTE — Assessment & Plan Note (Signed)
Much improved with CoQ10 and stopping simvastatin

## 2021-06-27 NOTE — Assessment & Plan Note (Signed)
Recommend low carb diet °Recheck A1c  °

## 2021-06-27 NOTE — Assessment & Plan Note (Signed)
Has stopped simvastatin Recheck FLP and CMP Consider other sttin Continue CoQ10

## 2021-06-27 NOTE — Assessment & Plan Note (Signed)
Discussed importance of healthy weight management Discussed diet and exercise  

## 2021-06-27 NOTE — Assessment & Plan Note (Signed)
Recheck TSH and free T4 

## 2021-06-27 NOTE — Assessment & Plan Note (Signed)
Was followed for stability on MRI in 2008

## 2021-06-27 NOTE — Progress Notes (Signed)
Established patient visit   Patient: Sharon Parker   DOB: May 02, 1956   65 y.o. Female  MRN: [id number] Visit Date: 06/27/2021  Today's healthcare provider: Lavon Paganini, MD   Chief Complaint  Patient presents with   Hypertension   Hyperlipidemia   Subjective    HPI  Hypertension, follow-up  BP Readings from Last 3 Encounters:  06/27/21 (!) 150/75  01/20/21 124/60  06/16/20 (!) 148/64   Wt Readings from Last 3 Encounters:  06/27/21 203 lb 9.6 oz (92.4 kg)  01/20/21 203 lb 8 oz (92.3 kg)  06/16/20 194 lb 8 oz (88.2 kg)     She was last seen for hypertension 1 years ago.  BP at that visit was 183/96. Management since that visit includes starting patient on Amlodipine '5mg'$ .  She reports excellent compliance with treatment. She is not having side effects.  She is following a Regular diet. She is exercising. She does not smoke.  Use of agents associated with hypertension: none.   Outside blood pressures are not being checked. Symptoms: No chest pain No chest pressure  No palpitations No syncope  No dyspnea No orthopnea  No paroxysmal nocturnal dyspnea Yes lower extremity edema   Pertinent labs: Lab Results  Component Value Date   CHOL 235 (H) 06/10/2020   HDL 54 06/10/2020   LDLCALC 144 (H) 06/10/2020   TRIG 205 (H) 06/10/2020   CHOLHDL 4.4 06/10/2020   Lab Results  Component Value Date   NA 142 06/10/2020   K 4.0 06/10/2020   CREATININE 0.68 06/10/2020   GFRNONAA 93 06/10/2020   GFRAA 107 06/10/2020   GLUCOSE 99 06/10/2020     The 10-year ASCVD risk score Mikey Bussing DC Jr., et al., 2013) is: 11.1%   --------------------------------------------------------------------------------------------------- Lipid/Cholesterol, Follow-up  Last lipid panel Other pertinent labs  Lab Results  Component Value Date   CHOL 235 (H) 06/10/2020   HDL 54 06/10/2020   LDLCALC 144 (H) 06/10/2020   TRIG 205 (H) 06/10/2020   CHOLHDL 4.4 06/10/2020   Lab Results   Component Value Date   ALT 15 06/10/2020   AST 16 06/10/2020   PLT 246 06/10/2020   TSH 1.860 06/10/2020     She was last seen for this 1 years ago.  Management since that visit includes none.  She reports poor compliance with treatment. Patient d/c medication in July 2022 She is having side effects. Patient reported muscle ache  Symptoms: No chest pain No chest pressure/discomfort  No dyspnea Yes lower extremity edema  No numbness or tingling of extremity No orthopnea  No palpitations No paroxysmal nocturnal dyspnea  No speech difficulty No syncope   Current diet: well balanced Current exercise: cardiovascular workout on exercise equipment  The 10-year ASCVD risk score Mikey Bussing DC Jr., et al., 2013) is: 11.1%   -----------------------------------------------------------------------------------------  Patient Active Problem List   Diagnosis Date Noted   Myalgia due to statin 06/27/2021   Encounter for breast cancer screening using non-mammogram modality 06/10/2020   Class 1 obesity due to excess calories with serious comorbidity and body mass index (BMI) of 31.0 to 31.9 in adult 06/10/2020   Essential hypertension 06/10/2020   Recurrent major depressive disorder, in full remission ([place]) 04/14/2019   Abnormal blood sugar 01/23/2016   Depression 01/23/2016   Adaptation reaction 01/20/2016   Adrenal adenoma 01/20/2016   Allergic rhinitis 01/20/2016   Adult BMI 30+ 01/20/2016   Adiposity 01/20/2016   Arthritis, degenerative 01/20/2016   Subclinical hypothyroidism 01/20/2016  History of smoking 25-50 pack years 01/20/2016   Hypercholesteremia 05/17/2015   Past Medical History:  Diagnosis Date   Allergy    Hyperlipidemia    Hypertension    No Known Allergies     Medications: Outpatient Medications Prior to Visit  Medication Sig   amLODipine (NORVASC) 5 MG tablet Take 1 tablet (5 mg total) by mouth daily.   Boswellia-Glucosamine-Vit D (OSTEO BI-FLEX ONE PER DAY)  TABS Take by mouth.   fexofenadine (ALLEGRA) 180 MG tablet Take 180 mg by mouth daily.   fluticasone (FLONASE) 50 MCG/ACT nasal spray Place 2 sprays into both nostrils daily.   furosemide (LASIX) 20 MG tablet Take 1 tablet (20 mg total) by mouth daily as needed for edema.   Multiple Vitamin (MULTIVITAMINS PO) Take by mouth.   UBIQUINOL PO Take by mouth.   [DISCONTINUED] simvastatin (ZOCOR) 40 MG tablet Take 1 tablet (40 mg total) by mouth every evening. (Patient not taking: Reported on 06/27/2021)   No facility-administered medications prior to visit.    Review of Systems per HPI     Objective    BP (!) 150/75   Pulse 75   Temp 98.1 F (36.7 C) (Oral)   Resp 16   Wt 203 lb 9.6 oz (92.4 kg)   SpO2 95%   BMI 32.86 kg/m     Physical Exam Vitals reviewed.  Constitutional:      General: She is not in acute distress.    Appearance: Normal appearance. She is well-developed. She is not diaphoretic.  HENT:     Head: Normocephalic and atraumatic.  Eyes:     General: No scleral icterus.    Conjunctiva/sclera: Conjunctivae normal.  Neck:     Thyroid: No thyromegaly.  Cardiovascular:     Rate and Rhythm: Normal rate and regular rhythm.     Pulses: Normal pulses.     Heart sounds: Normal heart sounds. No murmur heard. Pulmonary:     Effort: Pulmonary effort is normal. No respiratory distress.     Breath sounds: Normal breath sounds. No wheezing, rhonchi or rales.  Musculoskeletal:     Cervical back: Neck supple.     Right lower leg: No edema.     Left lower leg: No edema.  Lymphadenopathy:     Cervical: No cervical adenopathy.  Skin:    General: Skin is warm and dry.     Findings: No rash.  Neurological:     Mental Status: She is alert and oriented to person, place, and time. Mental status is at baseline.  Psychiatric:        Mood and Affect: Mood normal.        Behavior: Behavior normal.      No results found for any visits on 06/27/21.  Assessment & Plan      Problem List Items Addressed This Visit       Cardiovascular and Mediastinum   Essential hypertension - Primary    Uncontrolled Patient declines increase in medications at this time Will try 55mof lifestyle interventions and repeat Watch home BPs       Relevant Orders   Comprehensive metabolic panel     Endocrine   Adrenal adenoma    Was followed for stability on MRI in 2008       Subclinical hypothyroidism    Recheck TSH and free T4       Relevant Orders   TSH + free T4     Other   Hypercholesteremia  Has stopped simvastatin Recheck FLP and CMP Consider other sttin Continue CoQ10       Relevant Orders   Comprehensive metabolic panel   Lipid panel   Abnormal blood sugar    Recommend low carb diet Recheck A1c        Relevant Orders   Hemoglobin A1c   Class 1 obesity due to excess calories with serious comorbidity and body mass index (BMI) of 31.0 to 31.9 in adult    Discussed importance of healthy weight management Discussed diet and exercise        Myalgia due to statin    Much improved with CoQ10 and stopping simvastatin       Other Visit Diagnoses     Metatarsalgia of right foot       Relevant Orders   Ambulatory referral to Podiatry        Return in about 3 months (around 09/27/2021) for BP f/u.      I, Lavon Paganini, MD, have reviewed all documentation for this visit. The documentation on 06/27/21 for the exam, diagnosis, procedures, and orders are all accurate and complete.   , Dionne Bucy, MD, MPH [facility]

## 2021-06-27 NOTE — Assessment & Plan Note (Signed)
Uncontrolled Patient declines increase in medications at this time Will try 30mof lifestyle interventions and repeat Watch home BPs

## 2021-06-28 LAB — TSH+FREE T4
Free T4: 1.19 ng/dL (ref 0.82–1.77)
TSH: 2.52 u[IU]/mL (ref 0.450–4.500)

## 2021-06-28 LAB — COMPREHENSIVE METABOLIC PANEL
ALT: 16 IU/L (ref 0–32)
AST: 17 IU/L (ref 0–40)
Albumin/Globulin Ratio: 2.1 (ref 1.2–2.2)
Albumin: 4.8 g/dL (ref 3.8–4.8)
Alkaline Phosphatase: 110 IU/L (ref 44–121)
BUN/Creatinine Ratio: 16 (ref 12–28)
BUN: 12 mg/dL (ref 8–27)
Bilirubin Total: 0.4 mg/dL (ref 0.0–1.2)
CO2: 25 mmol/L (ref 20–29)
Calcium: 9.8 mg/dL (ref 8.7–10.3)
Chloride: 98 mmol/L (ref 96–106)
Creatinine, Ser: 0.75 mg/dL (ref 0.57–1.00)
Globulin, Total: 2.3 g/dL (ref 1.5–4.5)
Glucose: 94 mg/dL (ref 65–99)
Potassium: 4.4 mmol/L (ref 3.5–5.2)
Sodium: 139 mmol/L (ref 134–144)
Total Protein: 7.1 g/dL (ref 6.0–8.5)
eGFR: 88 mL/min/{1.73_m2} (ref >59)

## 2021-06-28 LAB — LIPID PANEL
Chol/HDL Ratio: 4.9 ratio — ABNORMAL HIGH (ref 0.0–4.4)
Cholesterol, Total: 288 mg/dL — ABNORMAL HIGH (ref 100–199)
HDL: 59 mg/dL (ref >39)
LDL Chol Calc (NIH): 181 mg/dL — ABNORMAL HIGH (ref 0–99)
Triglycerides: 255 mg/dL — ABNORMAL HIGH (ref 0–149)
VLDL Cholesterol Cal: 48 mg/dL — ABNORMAL HIGH (ref 5–40)

## 2021-06-28 LAB — HEMOGLOBIN A1C
Est. average glucose Bld gHb Est-mCnc: 117 mg/dL
Hgb A1c MFr Bld: 5.7 % — ABNORMAL HIGH (ref 4.8–5.6)

## 2021-06-29 ENCOUNTER — Other Ambulatory Visit: Payer: Self-pay

## 2021-06-29 MED ORDER — ROSUVASTATIN CALCIUM 5 MG PO TABS: 5.0000 mg | ORAL_TABLET | Freq: Every day | ORAL | 3 refills | Status: DC

## 2021-07-03 ENCOUNTER — Ambulatory Visit: Payer: Medicare Other | Admitting: Podiatry

## 2021-07-03 ENCOUNTER — Other Ambulatory Visit: Payer: Self-pay | Admitting: Podiatry

## 2021-07-03 ENCOUNTER — Encounter: Payer: Self-pay | Admitting: Podiatry

## 2021-07-03 ENCOUNTER — Ambulatory Visit (INDEPENDENT_AMBULATORY_CARE_PROVIDER_SITE_OTHER): Payer: Medicare Other

## 2021-07-03 ENCOUNTER — Other Ambulatory Visit: Payer: Self-pay

## 2021-07-03 DIAGNOSIS — M778 Other enthesopathies, not elsewhere classified: Secondary | ICD-10-CM | POA: Diagnosis not present

## 2021-07-03 DIAGNOSIS — M7741 Metatarsalgia, right foot: Secondary | ICD-10-CM

## 2021-07-03 MED ORDER — TRIAMCINOLONE ACETONIDE 40 MG/ML IJ SUSP
20.0000 mg | Freq: Once | INTRAMUSCULAR | Status: AC
  Administered 2021-07-03: 20 mg

## 2021-07-03 NOTE — Progress Notes (Signed)
Subjective:  Patient ID: Sharon Parker, female    DOB: 07/03/1956,  MRN: [id number] HPI Chief Complaint  Patient presents with   Foot Pain    Plantar forefoot right - aching x few months, no injury, tried heat, padding, Ben-gay, Tylenol-no help   New Patient (Initial Visit)    65 y.o. female presents with the above complaint.   ROS: Denies fever chills nausea vomiting muscle aches pains calf pain back pain chest pain shortness of breath.  Past Medical History:  Diagnosis Date   Allergy    Hyperlipidemia    Hypertension    Past Surgical History:  Procedure Laterality Date   CESAREAN SECTION  1994   COLONOSCOPY  2009   COLONOSCOPY WITH PROPOFOL N/A 04/30/2018   Procedure: COLONOSCOPY WITH PROPOFOL;  Surgeon: Robert Bellow, MD;  Location: [facility] ENDOSCOPY;  Service: Endoscopy;  Laterality: N/A;   HERNIA REPAIR     NASAL SEPTUM SURGERY  1983   NECK SURGERY  2002   disc in neck replaced C5-C6   TONSILLECTOMY     TONSILLECTOMY AND ADENOIDECTOMY  1970    Current Outpatient Medications:    amLODipine (NORVASC) 5 MG tablet, Take 1 tablet (5 mg total) by mouth daily., Disp: 90 tablet, Rfl: 3   Boswellia-Glucosamine-Vit D (OSTEO BI-FLEX ONE PER DAY) TABS, Take by mouth., Disp: , Rfl:    fexofenadine (ALLEGRA) 180 MG tablet, Take 180 mg by mouth daily., Disp: , Rfl:    fluticasone (FLONASE) 50 MCG/ACT nasal spray, Place 2 sprays into both nostrils daily., Disp: 16 g, Rfl: 10   furosemide (LASIX) 20 MG tablet, Take 1 tablet (20 mg total) by mouth daily as needed for edema., Disp: 30 tablet, Rfl: 3   Multiple Vitamin (MULTIVITAMINS PO), Take by mouth., Disp: , Rfl:    rosuvastatin (CRESTOR) 5 MG tablet, Take 1 tablet (5 mg total) by mouth daily., Disp: 90 tablet, Rfl: 3   UBIQUINOL PO, Take by mouth., Disp: , Rfl:   No Known Allergies Review of Systems Objective:  There were no vitals filed for this visit.  General: Well developed, nourished, in no acute distress, alert and  oriented x3   Dermatological: Skin is warm, dry and supple bilateral. Nails x 10 are well maintained; remaining integument appears unremarkable at this time. There are no open sores, no preulcerative lesions, no rash or signs of infection present.  Vascular: Dorsalis Pedis artery and Posterior Tibial artery pedal pulses are 2/4 bilateral with immedate capillary fill time. Pedal hair growth present. No varicosities and no lower extremity edema present bilateral.   Neruologic: Grossly intact via light touch bilateral. Vibratory intact via tuning fork bilateral. Protective threshold with Semmes Wienstein monofilament intact to all pedal sites bilateral. Patellar and Achilles deep tendon reflexes 2+ bilateral. No Babinski or clonus noted bilateral.   Musculoskeletal: No gross boney pedal deformities bilateral. No pain, crepitus, or limitation noted with foot and ankle range of motion bilateral. Muscular strength 5/5 in all groups tested bilateral.  Pain on palpation and end range of motion of the second metatarsophalangeal joint of the right foot.  The joint appears to be stable.  Gait: Unassisted, Nonantalgic.    Radiographs:  Radiographs taken today demonstrate an osseously mature individual.  Mild osteopenia.  No acute findings.  Elongated second metatarsal at the metatarsal phalangeal joint does demonstrate some soft tissue swelling.  Assessment & Plan:   Assessment: Capsulitis of the second metatarsal phalangeal joint right foot.  Plan: Discussed etiology pathology conservative  versus surgical therapies.  At this point I injected around the joint with 10 mg Kenalog 5 mg Marcaine.  Discussed appropriate shoe gear and stiffness with her.  I will follow-up with her as needed.      T. [place], [place]

## 2021-07-20 ENCOUNTER — Other Ambulatory Visit: Payer: Self-pay

## 2021-07-20 ENCOUNTER — Ambulatory Visit
Admission: RE | Admit: 2021-07-20 | Discharge: 2021-07-20 | Disposition: A | Discharge status: Home / Self Care | Payer: Medicare Other | Source: Ambulatory Visit | Attending: Family Medicine | Admitting: Family Medicine

## 2021-07-20 DIAGNOSIS — M8589 Other specified disorders of bone density and structure, multiple sites: Secondary | ICD-10-CM | POA: Diagnosis not present

## 2021-07-20 DIAGNOSIS — Z780 Asymptomatic menopausal state: Secondary | ICD-10-CM | POA: Diagnosis not present

## 2021-09-29 ENCOUNTER — Ambulatory Visit (INDEPENDENT_AMBULATORY_CARE_PROVIDER_SITE_OTHER): Payer: Medicare Other | Admitting: Family Medicine

## 2021-09-29 ENCOUNTER — Other Ambulatory Visit: Payer: Self-pay

## 2021-09-29 ENCOUNTER — Encounter: Payer: Self-pay | Admitting: Family Medicine

## 2021-09-29 VITALS — BP 139/78 | HR 71 | Temp 98.6°F | Resp 16 | Ht 66.0 in | Wt 207.0 lb

## 2021-09-29 DIAGNOSIS — N6001 Solitary cyst of right breast: Secondary | ICD-10-CM

## 2021-09-29 DIAGNOSIS — E6609 Other obesity due to excess calories: Secondary | ICD-10-CM

## 2021-09-29 DIAGNOSIS — E7800 Pure hypercholesterolemia, unspecified: Secondary | ICD-10-CM

## 2021-09-29 DIAGNOSIS — I10 Essential (primary) hypertension: Secondary | ICD-10-CM

## 2021-09-29 DIAGNOSIS — Z1231 Encounter for screening mammogram for malignant neoplasm of breast: Secondary | ICD-10-CM | POA: Diagnosis not present

## 2021-09-29 DIAGNOSIS — Z6831 Body mass index (BMI) 31.0-31.9, adult: Secondary | ICD-10-CM

## 2021-09-29 DIAGNOSIS — Z1211 Encounter for screening for malignant neoplasm of colon: Secondary | ICD-10-CM

## 2021-09-29 MED ORDER — FUROSEMIDE 20 MG PO TABS: 20.0000 mg | ORAL_TABLET | Freq: Every day | ORAL | 3 refills | Status: DC | PRN

## 2021-09-29 MED ORDER — AMLODIPINE BESYLATE 5 MG PO TABS
5.0000 mg | ORAL_TABLET | Freq: Every day | ORAL | 3 refills | Status: DC
Start: 2021-09-29 — End: 2021-11-21

## 2021-09-29 MED ORDER — ROSUVASTATIN CALCIUM 5 MG PO TABS
5.0000 mg | ORAL_TABLET | Freq: Every day | ORAL | 3 refills | Status: DC
Start: 2021-09-29 — End: 2022-06-15

## 2021-09-29 NOTE — Assessment & Plan Note (Signed)
Discussed importance of healthy weight management Discussed diet and exercise  

## 2021-09-29 NOTE — Assessment & Plan Note (Signed)
Previously uncontrolled Continue statin - tolerating Crestor well Repeat FLP and CMP Continue CoQ10

## 2021-09-29 NOTE — Progress Notes (Signed)
I,April Miller,acting as a scribe for Lavon Paganini, MD.,have documented all relevant documentation on the behalf of Lavon Paganini, MD,as directed by  Lavon Paganini, MD while in the presence of Lavon Paganini, MD.   Established patient visit   Patient: Sharon Parker   DOB: Mar 29, 1956   65 y.o. Female  MRN: [id number] Visit Date: 09/29/2021  Today's healthcare provider: Lavon Paganini, MD   Chief Complaint  Patient presents with   Follow-up   Hypertension   Hyperlipidemia   Subjective    HPI  Hypertension, follow-up  BP Readings from Last 3 Encounters:  09/29/21 139/78  06/27/21 (!) 150/75  01/20/21 124/60   Wt Readings from Last 3 Encounters:  09/29/21 207 lb (93.9 kg)  06/27/21 203 lb 9.6 oz (92.4 kg)  01/20/21 203 lb 8 oz (92.3 kg)     She was last seen for hypertension 3 months ago.  BP at that visit was 150/75. Management since that visit includes advised patient to increase medications. Patient declined, will try healthy lifestyle changes. She reports good compliance with treatment. She is not having side effects. none She is following a Regular diet. She is not exercising. She does not smoke.  Use of agents associated with hypertension: none.   Outside blood pressures are 135/74.  Pertinent labs: Lab Results  Component Value Date   CHOL 288 (H) 06/27/2021   HDL 59 06/27/2021   LDLCALC 181 (H) 06/27/2021   TRIG 255 (H) 06/27/2021   CHOLHDL 4.9 (H) 06/27/2021   Lab Results  Component Value Date   NA 139 06/27/2021   K 4.4 06/27/2021   CREATININE 0.75 06/27/2021   EGFR 88 06/27/2021   GLUCOSE 94 06/27/2021   TSH 2.520 06/27/2021     The 10-year ASCVD risk score (Arnett DK, et al., 2019) is: 10.2%   --------------------------------------------------------------------------------------------------- Lipid/Cholesterol, Follow-up  Last lipid panel Other pertinent labs  Lab Results  Component Value Date   CHOL 288 (H)  06/27/2021   HDL 59 06/27/2021   LDLCALC 181 (H) 06/27/2021   TRIG 255 (H) 06/27/2021   CHOLHDL 4.9 (H) 06/27/2021   Lab Results  Component Value Date   ALT 16 06/27/2021   AST 17 06/27/2021   PLT 246 06/10/2020   TSH 2.520 06/27/2021     She was last seen for this 3 months ago.  Management since that visit includes S/C simvastatin and start Crestor 87m daily.  She reports good compliance with treatment. She is not having side effects. none  Current diet: well balanced Current exercise: none  The 10-year ASCVD risk score (Arnett DK, et al., 2019) is: 10.2%  ---------------------------------------------------------------------------------------------------     Medications: Outpatient Medications Prior to Visit  Medication Sig   Boswellia-Glucosamine-Vit D (OSTEO BI-FLEX ONE PER DAY) TABS Take by mouth.   fexofenadine (ALLEGRA) 180 MG tablet Take 180 mg by mouth daily.   fluticasone (FLONASE) 50 MCG/ACT nasal spray Place 2 sprays into both nostrils daily.   Multiple Vitamin (MULTIVITAMINS PO) Take by mouth.   UBIQUINOL PO Take by mouth.   [DISCONTINUED] amLODipine (NORVASC) 5 MG tablet Take 1 tablet (5 mg total) by mouth daily.   [DISCONTINUED] furosemide (LASIX) 20 MG tablet Take 1 tablet (20 mg total) by mouth daily as needed for edema.   [DISCONTINUED] rosuvastatin (CRESTOR) 5 MG tablet Take 1 tablet (5 mg total) by mouth daily.   No facility-administered medications prior to visit.    Review of Systems per HPI  Objective    BP 139/78 (BP Location: Left Arm, Patient Position: Sitting, Cuff Size: Large)   Pulse 71   Temp 98.6 F (37 C) (Oral)   Resp 16   Ht _0  (1.676 m)   Wt 207 lb (93.9 kg)   SpO2 96%   BMI 33.41 kg/m  BP Readings from Last 3 Encounters:  09/29/21 139/78  06/27/21 (!) 150/75  01/20/21 124/60   Wt Readings from Last 3 Encounters:  09/29/21 207 lb (93.9 kg)  06/27/21 203 lb 9.6 oz (92.4 kg)  01/20/21 203 lb 8 oz (92.3 kg)       Physical Exam Vitals reviewed.  Constitutional:      General: She is not in acute distress.    Appearance: Normal appearance. She is well-developed. She is not diaphoretic.  HENT:     Head: Normocephalic and atraumatic.  Eyes:     General: No scleral icterus.    Conjunctiva/sclera: Conjunctivae normal.  Neck:     Thyroid: No thyromegaly.  Cardiovascular:     Rate and Rhythm: Normal rate and regular rhythm.     Pulses: Normal pulses.     Heart sounds: Normal heart sounds. No murmur heard. Pulmonary:     Effort: Pulmonary effort is normal. No respiratory distress.     Breath sounds: Normal breath sounds. No wheezing, rhonchi or rales.  Musculoskeletal:     Cervical back: Neck supple.     Right lower leg: No edema.     Left lower leg: No edema.  Lymphadenopathy:     Cervical: No cervical adenopathy.  Skin:    General: Skin is warm and dry.     Findings: No rash.  Neurological:     Mental Status: She is alert and oriented to person, place, and time. Mental status is at baseline.  Psychiatric:        Mood and Affect: Mood normal.        Behavior: Behavior normal.      No results found for any visits on 09/29/21.  Assessment & Plan     Problem List Items Addressed This Visit       Cardiovascular and Mediastinum   Essential hypertension - Primary    Well controlled Continue current medications Recheck metabolic panel       Relevant Medications   rosuvastatin (CRESTOR) 5 MG tablet   amLODipine (NORVASC) 5 MG tablet   furosemide (LASIX) 20 MG tablet   Other Relevant Orders   Comprehensive metabolic panel     Other   Hypercholesteremia    Previously uncontrolled Continue statin - tolerating Crestor well Repeat FLP and CMP Continue CoQ10      Relevant Medications   rosuvastatin (CRESTOR) 5 MG tablet   amLODipine (NORVASC) 5 MG tablet   furosemide (LASIX) 20 MG tablet   Other Relevant Orders   Lipid panel   Comprehensive metabolic panel   Class 1  obesity due to excess calories with serious comorbidity and body mass index (BMI) of 31.0 to 31.9 in adult    Discussed importance of healthy weight management Discussed diet and exercise       Other Visit Diagnoses     Cyst of right breast       Relevant Orders   MM DIAG BREAST TOMO UNI RIGHT   US BREAST COMPLETE UNI RIGHT INC AXILLA   Screening mammogram for breast cancer       Relevant Orders   MM 3D SCREEN BREAST UNI LEFT  Colon cancer screening       Relevant Orders   Cologuard        Return in about 4 months (around 01/27/2022) for chronic disease f/u.      I, Lavon Paganini, MD, have reviewed all documentation for this visit. The documentation on 09/29/21 for the exam, diagnosis, procedures, and orders are all accurate and complete.   , Dionne Bucy, MD, MPH [facility]

## 2021-09-29 NOTE — Assessment & Plan Note (Signed)
Well controlled Continue current medications Recheck metabolic panel 

## 2021-09-30 LAB — COMPREHENSIVE METABOLIC PANEL
ALT: 17 IU/L (ref 0–32)
AST: 18 IU/L (ref 0–40)
Albumin/Globulin Ratio: 2 (ref 1.2–2.2)
Albumin: 4.7 g/dL (ref 3.8–4.8)
Alkaline Phosphatase: 111 IU/L (ref 44–121)
BUN/Creatinine Ratio: 17 (ref 12–28)
BUN: 12 mg/dL (ref 8–27)
Bilirubin Total: 0.4 mg/dL (ref 0.0–1.2)
CO2: 26 mmol/L (ref 20–29)
Calcium: 9.3 mg/dL (ref 8.7–10.3)
Chloride: 102 mmol/L (ref 96–106)
Creatinine, Ser: 0.71 mg/dL (ref 0.57–1.00)
Globulin, Total: 2.3 g/dL (ref 1.5–4.5)
Glucose: 105 mg/dL — ABNORMAL HIGH (ref 70–99)
Potassium: 4.4 mmol/L (ref 3.5–5.2)
Sodium: 141 mmol/L (ref 134–144)
Total Protein: 7 g/dL (ref 6.0–8.5)
eGFR: 94 mL/min/{1.73_m2} (ref >59)

## 2021-09-30 LAB — LIPID PANEL
Chol/HDL Ratio: 3.5 ratio (ref 0.0–4.4)
Cholesterol, Total: 220 mg/dL — ABNORMAL HIGH (ref 100–199)
HDL: 62 mg/dL (ref >39)
LDL Chol Calc (NIH): 131 mg/dL — ABNORMAL HIGH (ref 0–99)
Triglycerides: 154 mg/dL — ABNORMAL HIGH (ref 0–149)
VLDL Cholesterol Cal: 27 mg/dL (ref 5–40)

## 2021-10-16 DIAGNOSIS — Z1211 Encounter for screening for malignant neoplasm of colon: Secondary | ICD-10-CM | POA: Diagnosis not present

## 2021-10-17 ENCOUNTER — Ambulatory Visit
Admission: RE | Admit: 2021-10-17 | Discharge: 2021-10-17 | Disposition: A | Discharge status: Home / Self Care | Payer: Medicare Other | Source: Ambulatory Visit | Attending: Family Medicine | Admitting: Family Medicine

## 2021-10-17 ENCOUNTER — Other Ambulatory Visit: Payer: Self-pay

## 2021-10-17 DIAGNOSIS — N6001 Solitary cyst of right breast: Secondary | ICD-10-CM | POA: Diagnosis not present

## 2021-10-17 DIAGNOSIS — N644 Mastodynia: Secondary | ICD-10-CM | POA: Diagnosis not present

## 2021-10-17 DIAGNOSIS — R922 Inconclusive mammogram: Secondary | ICD-10-CM | POA: Diagnosis not present

## 2021-10-19 LAB — COLOGUARD: COLOGUARD: NEGATIVE

## 2021-11-17 ENCOUNTER — Other Ambulatory Visit: Payer: Self-pay | Admitting: Physician Assistant

## 2021-12-03 ENCOUNTER — Other Ambulatory Visit: Payer: Self-pay | Admitting: Family Medicine

## 2021-12-03 NOTE — Telephone Encounter (Signed)
Requested Prescriptions  Pending Prescriptions Disp Refills   furosemide (LASIX) 20 MG tablet [Pharmacy Med Name: Furosemide 20 MG Oral Tablet] 90 tablet 1    Sig: TAKE 1 TABLET BY MOUTH DAILY AS  NEEDED FOR EDEMA     Cardiovascular:  Diuretics - Loop Passed - 12/03/2021  6:50 AM      Passed - K in normal range and within 360 days    Potassium  Date Value Ref Range Status  09/29/2021 4.4 3.5 - 5.2 mmol/L Final         Passed - Ca in normal range and within 360 days    Calcium  Date Value Ref Range Status  09/29/2021 9.3 8.7 - 10.3 mg/dL Final         Passed - Na in normal range and within 360 days    Sodium  Date Value Ref Range Status  09/29/2021 141 134 - 144 mmol/L Final         Passed - Cr in normal range and within 360 days    Creatinine, Ser  Date Value Ref Range Status  09/29/2021 0.71 0.57 - 1.00 mg/dL Final         Passed - Last BP in normal range    BP Readings from Last 1 Encounters:  09/29/21 139/78         Passed - Valid encounter within last 6 months    Recent Outpatient Visits          2 months ago Essential hypertension   [facility] [place], Dionne Bucy, MD   5 months ago Essential hypertension   [place], Dionne Bucy, MD   5 months ago [employer] preventive visit   [facility], Dionne Bucy, MD   10 months ago Acute non-recurrent pansinusitis   [facility] [place], Dionne Bucy, MD   1 year ago Annual physical exam   [place], Clearnce Sorrel, [place]      Future Appointments            In 1 month [place], Dionne Bucy, MD [facility], [place]

## 2022-01-29 NOTE — Progress Notes (Unsigned)
Established patient visit   Patient: Sharon Parker   DOB: [birth year]   66 y.o. Female  MRN: [id number] Visit Date: 01/30/2022  Today's healthcare provider: Lavon Paganini, MD   No chief complaint on file.  Subjective    HPI  Hypertension, follow-up  BP Readings from Last 3 Encounters:  09/29/21 139/78  06/27/21 (!) 150/75  01/20/21 124/60   Wt Readings from Last 3 Encounters:  09/29/21 207 lb (93.9 kg)  06/27/21 203 lb 9.6 oz (92.4 kg)  01/20/21 203 lb 8 oz (92.3 kg)     She was last seen for hypertension 4 months ago.  BP at that visit was 139/78. Management since that visit includes no changes. She reports {excellent/good/fair/poor:19665} compliance with treatment. She {is/is not:9024} having side effects. {document side effects if present:1} She {is/is not:9024} exercising. She {is/is not:9024} adherent to low salt diet.   Outside blood pressures are {enter patient reported home BP, or 'not being checked':1}.  She {does/does not:200015} smoke.  Use of agents associated with hypertension: {bp agents assoc with hypertension:511::"none"}.   --------------------------------------------------------------------------------------------------- Lipid/Cholesterol, follow-up  Last Lipid Panel: Lab Results  Component Value Date   CHOL 220 (H) 09/29/2021   LDLCALC 131 (H) 09/29/2021   HDL 62 09/29/2021   TRIG 154 (H) 09/29/2021    She was last seen for this 4 months ago.  Management since that visit includes no changes.  She reports {excellent/good/fair/poor:19665} compliance with treatment. She {is/is not:9024} having side effects. {document side effects if present:1}  Symptoms: {Yes/No:20286} appetite changes {Yes/No:20286} foot ulcerations  {Yes/No:20286} chest pain {Yes/No:20286} chest pressure/discomfort  {Yes/No:20286} dyspnea {Yes/No:20286} orthopnea  {Yes/No:20286} fatigue {Yes/No:20286} lower extremity edema  {Yes/No:20286} palpitations  {Yes/No:20286} paroxysmal nocturnal dyspnea  {Yes/No:20286} nausea {Yes/No:20286} numbness or tingling of extremity  {Yes/No:20286} polydipsia {Yes/No:20286} polyuria  {Yes/No:20286} speech difficulty {Yes/No:20286} syncope   She is following a {diet:21022986} diet. Current exercise: {exercise YCXKG:81856}  Last metabolic panel Lab Results  Component Value Date   GLUCOSE 105 (H) 09/29/2021   NA 141 09/29/2021   K 4.4 09/29/2021   BUN 12 09/29/2021   CREATININE 0.71 09/29/2021   EGFR 94 09/29/2021   GFRNONAA 93 06/10/2020   CALCIUM 9.3 09/29/2021   AST 18 09/29/2021   ALT 17 09/29/2021   The 10-year ASCVD risk score (Arnett DK, et al., 2019) is: 8.7%  --------------------------------------------------------------------------------------------------- Hypothyroid, follow-up  Lab Results  Component Value Date   TSH 2.520 06/27/2021   TSH 1.860 06/10/2020   TSH 1.820 04/14/2019   FREET4 1.19 06/27/2021    Wt Readings from Last 3 Encounters:  09/29/21 207 lb (93.9 kg)  06/27/21 203 lb 9.6 oz (92.4 kg)  01/20/21 203 lb 8 oz (92.3 kg)    She was last seen for hypothyroid 7 months ago.  Management since that visit includes no changes. She reports {excellent/good/fair/poor:19665} compliance with treatment. She {is/is not:21021397} having side effects. {document side effects if present:1}  Symptoms: {Yes/No:20286} change in energy level {Yes/No:20286} constipation  {Yes/No:20286} diarrhea {Yes/No:20286} heat / cold intolerance  {Yes/No:20286} nervousness {Yes/No:20286} palpitations  {Yes/No:20286} weight changes    -----------------------------------------------------------------------------------------   Medications: Outpatient Medications Prior to Visit  Medication Sig   amLODipine (NORVASC) 5 MG tablet TAKE 1 TABLET(5 MG) BY MOUTH DAILY   Boswellia-Glucosamine-Vit D (OSTEO BI-FLEX ONE PER DAY) TABS Take by mouth.   fexofenadine (ALLEGRA) 180 MG tablet Take 180 mg  by mouth daily.   fluticasone (FLONASE) 50 MCG/ACT nasal spray Place 2 sprays into both nostrils daily.  furosemide (LASIX) 20 MG tablet TAKE 1 TABLET BY MOUTH DAILY AS  NEEDED FOR EDEMA   Multiple Vitamin (MULTIVITAMINS PO) Take by mouth.   rosuvastatin (CRESTOR) 5 MG tablet Take 1 tablet (5 mg total) by mouth daily.   UBIQUINOL PO Take by mouth.   No facility-administered medications prior to visit.    Review of Systems      Objective    There were no vitals taken for this visit. BP Readings from Last 3 Encounters:  09/29/21 139/78  06/27/21 (!) 150/75  01/20/21 124/60   Wt Readings from Last 3 Encounters:  09/29/21 207 lb (93.9 kg)  06/27/21 203 lb 9.6 oz (92.4 kg)  01/20/21 203 lb 8 oz (92.3 kg)      Physical Exam  ***  No results found for any visits on 01/30/22.  Assessment & Plan     ***  No follow-ups on file.      {provider attestation***:1}   Lavon Paganini, MD  [facility] [phone] (phone) [phone] (fax)  [place]

## 2022-01-30 ENCOUNTER — Encounter: Payer: Self-pay | Admitting: Family Medicine

## 2022-01-30 ENCOUNTER — Ambulatory Visit (INDEPENDENT_AMBULATORY_CARE_PROVIDER_SITE_OTHER): Payer: Medicare Other | Admitting: Family Medicine

## 2022-01-30 ENCOUNTER — Other Ambulatory Visit: Payer: Self-pay

## 2022-01-30 VITALS — BP 126/60 | HR 73 | Temp 97.6°F | Resp 16 | Wt 206.0 lb

## 2022-01-30 DIAGNOSIS — E038 Other specified hypothyroidism: Secondary | ICD-10-CM

## 2022-01-30 DIAGNOSIS — Z6833 Body mass index (BMI) 33.0-33.9, adult: Secondary | ICD-10-CM

## 2022-01-30 DIAGNOSIS — E7800 Pure hypercholesterolemia, unspecified: Secondary | ICD-10-CM

## 2022-01-30 DIAGNOSIS — E669 Obesity, unspecified: Secondary | ICD-10-CM | POA: Diagnosis not present

## 2022-01-30 DIAGNOSIS — R7303 Prediabetes: Secondary | ICD-10-CM | POA: Insufficient documentation

## 2022-01-30 DIAGNOSIS — I10 Essential (primary) hypertension: Secondary | ICD-10-CM

## 2022-01-30 NOTE — Assessment & Plan Note (Signed)
Discussed importance of healthy weight management °Discussed diet and exercise  °Discussed intermittent fasting °

## 2022-01-30 NOTE — Assessment & Plan Note (Signed)
Previously improving ?Continue statin ?Repeat FLP and CMP ?

## 2022-01-30 NOTE — Assessment & Plan Note (Signed)
Recheck TSH and free T4 

## 2022-01-30 NOTE — Assessment & Plan Note (Signed)
Well controlled Continue current medications Recheck metabolic panel F/u in 6 months  

## 2022-01-30 NOTE — Assessment & Plan Note (Signed)
Recommend low carb diet °Recheck A1c  °

## 2022-01-31 LAB — LIPID PANEL
Chol/HDL Ratio: 3.6 ratio (ref 0.0–4.4)
Cholesterol, Total: 203 mg/dL — ABNORMAL HIGH (ref 100–199)
HDL: 56 mg/dL (ref >39)
LDL Chol Calc (NIH): 108 mg/dL — ABNORMAL HIGH (ref 0–99)
Triglycerides: 227 mg/dL — ABNORMAL HIGH (ref 0–149)
VLDL Cholesterol Cal: 39 mg/dL (ref 5–40)

## 2022-01-31 LAB — COMPREHENSIVE METABOLIC PANEL
ALT: 15 IU/L (ref 0–32)
AST: 15 IU/L (ref 0–40)
Albumin/Globulin Ratio: 2 (ref 1.2–2.2)
Albumin: 4.5 g/dL (ref 3.8–4.8)
Alkaline Phosphatase: 97 IU/L (ref 44–121)
BUN/Creatinine Ratio: 16 (ref 12–28)
BUN: 12 mg/dL (ref 8–27)
Bilirubin Total: 0.4 mg/dL (ref 0.0–1.2)
CO2: 25 mmol/L (ref 20–29)
Calcium: 9.3 mg/dL (ref 8.7–10.3)
Chloride: 103 mmol/L (ref 96–106)
Creatinine, Ser: 0.75 mg/dL (ref 0.57–1.00)
Globulin, Total: 2.3 g/dL (ref 1.5–4.5)
Glucose: 112 mg/dL — ABNORMAL HIGH (ref 70–99)
Potassium: 4.4 mmol/L (ref 3.5–5.2)
Sodium: 142 mmol/L (ref 134–144)
Total Protein: 6.8 g/dL (ref 6.0–8.5)
eGFR: 88 mL/min/{1.73_m2} (ref >59)

## 2022-01-31 LAB — TSH+FREE T4
Free T4: 1.12 ng/dL (ref 0.82–1.77)
TSH: 2.61 u[IU]/mL (ref 0.450–4.500)

## 2022-01-31 LAB — HEMOGLOBIN A1C
Est. average glucose Bld gHb Est-mCnc: 117 mg/dL
Hgb A1c MFr Bld: 5.7 % — ABNORMAL HIGH (ref 4.8–5.6)

## 2022-03-23 ENCOUNTER — Other Ambulatory Visit: Payer: Self-pay | Admitting: Family Medicine

## 2022-04-09 ENCOUNTER — Ambulatory Visit: Payer: Self-pay

## 2022-04-09 NOTE — Telephone Encounter (Signed)
Noted  

## 2022-04-09 NOTE — Telephone Encounter (Signed)
  Chief Complaint: Leg swelling- left side worse than right Symptoms: Swelling in both legs - pain in left knee and  Frequency: past 1.5 weeks Pertinent Negatives: Patient denies fever, SOB Disposition: '[]'$ ED /'[x]'$ Urgent Care (no appt availability in office) / '[]'$ Appointment(In office/virtual)/ '[]'$  [place] [facility]/ '[]'$ Home Care/ '[]'$ Refused Recommended Disposition /'[]'$ [place] [facility]/ '[]'$  Follow-up with PCP Additional Notes: Pt has had leg swelling for 1.5 weeks and pain in left knee. Pt states that left leg is more swollen.  Reason for Disposition  [1] Thigh, calf, or ankle swelling AND [2] bilateral AND [3] 1 side is more swollen  Answer Assessment - Initial Assessment Questions 1. ONSET: "When did the swelling start?" (e.g., minutes, hours, days)     1.5 weeks 2. LOCATION: "What part of the leg is swollen?"  "Are both legs swollen or just one leg?"     Both legs - left leg more - above the knee 3. SEVERITY: "How bad is the swelling?" (e.g., localized; mild, moderate, severe)  - Localized - small area of swelling localized to one leg  - MILD pedal edema - swelling limited to foot and ankle, pitting edema < 1/4 inch (6 mm) deep, rest and elevation eliminate most or all swelling  - MODERATE edema - swelling of lower leg to knee, pitting edema > 1/4 inch (6 mm) deep, rest and elevation only partially reduce swelling  - SEVERE edema - swelling extends above knee, facial or hand swelling present      moderate 4. REDNESS: "Does the swelling look red or infected?"     no 5. PAIN: "Is the swelling painful to touch?" If Yes, ask: "How painful is it?"   (Scale 1-10; mild, moderate or severe)     Left knee - 8/10 6. FEVER: "Do you have a fever?" If Yes, ask: "What is it, how was it measured, and when did it start?"      no 7. CAUSE: "What do you think is causing the leg swelling?"     Foot problem and weight - twisted 8. MEDICAL HISTORY: "Do you have a history of heart failure, kidney  disease, liver failure, or cancer?"     no 9. RECURRENT SYMPTOM: "Have you had leg swelling before?" If Yes, ask: "When was the last time?" "What happened that time?"     no 10. OTHER SYMPTOMS: "Do you have any other symptoms?" (e.g., chest pain, difficulty breathing)       no 11. PREGNANCY: "Is there any chance you are pregnant?" "When was your last menstrual period?"       na  Protocols used: Leg Swelling and Edema-A-AH

## 2022-04-09 NOTE — Progress Notes (Unsigned)
I, J ,acting as a scribe for Myles Gip, DO.,have documented all relevant documentation on the behalf of Myles Gip, DO,as directed by  Myles Gip, DO while in the presence of Myles Gip, DO.   Established patient visit   Patient: Sharon Parker   DOB: 25-Nov-1955   66 y.o. Female  MRN: [id number] Visit Date: 04/10/2022  Today's healthcare provider: Myles Gip, DO   Chief Complaint  Patient presents with   Leg Swelling    Patient complains of L knee swelling and pain for 2 weeks.   Subjective    HPI HPI     Leg Swelling    Additional comments: Patient complains of L knee swelling and pain for 2 weeks.      Last edited by Smitty Knudsen, CMA on 04/10/2022  9:07 AM.      KNEE PAIN - went to [place] few weeks ago, walked a lot. - has capsulitis in toes, feels she has been compensating for feet pain.  Duration: 2 weeks Involved knee: left Mechanism of injury: unknown Location: sides Onset: gradual Quality:  achy Radiation: no Aggravating factors: walking, stairs  Alleviating factors: ice Treatments attempted: tylenol, ice, dream cream  Relief with NSAIDs?:  No NSAIDs Taken Weakness with weight bearing or walking: no Sensation of giving way: no Locking: no Popping: no Bruising: no Swelling: yes Redness: no Paresthesias/decreased sensation: no Fevers: no   Medications: Outpatient Medications Prior to Visit  Medication Sig   amLODipine (NORVASC) 5 MG tablet TAKE 1 TABLET(5 MG) BY MOUTH DAILY   Boswellia-Glucosamine-Vit D (OSTEO BI-FLEX ONE PER DAY) TABS Take by mouth.   fexofenadine (ALLEGRA) 180 MG tablet Take 180 mg by mouth daily.   fluticasone (FLONASE) 50 MCG/ACT nasal spray Place 2 sprays into both nostrils daily.   furosemide (LASIX) 20 MG tablet TAKE 1 TABLET BY MOUTH DAILY AS  NEEDED FOR EDEMA   Multiple Vitamin (MULTIVITAMINS PO) Take by mouth.   rosuvastatin (CRESTOR) 5 MG tablet Take 1 tablet (5 mg  total) by mouth daily.   UBIQUINOL PO Take by mouth.   No facility-administered medications prior to visit.    Review of Systems     Objective    BP (!) 150/82 (BP Location: Right Arm, Patient Position: Sitting, Cuff Size: Normal)   Pulse 68   Temp 98.1 F (36.7 C) (Oral)   Resp 16   Ht '5\' 5"'$  (1.651 m)   Wt 207 lb 6.4 oz (94.1 kg)   SpO2 98%   BMI 34.51 kg/m    Gen: well appearing, in NAD Card: RRR Lungs: CTAB Ext: WWP, no edema MSK: L Knee: no obvious swelling, asymmetry. Mild medial and lateral joint line tenderness. No joint laxity. +Thessaly. Negative ant/post drawer, lachmann's. Negative Homans.  Limited US of L knee: Findings: No suprapatellar fluid collection. Quad and patellar tendons intact. Mild spurring of interior patella. Preserved lateral joint space, lateral meniscus slightly bulged but without tear or hypervascularity. Decreased medial joint space with bulging mensicus and hypervascularity. Slight spurring at bilateral joint space. No Baker's cyst. Popliteal vein fully compressible. Impression: Inflammation of medial joint space and meniscus without obvious tears. No Baker's cyst. No evidence of blood clot.   No results found for any visits on 04/10/22.  Assessment & Plan     Problem List Items Addressed This Visit       Musculoskeletal and Integument   Arthritis, degenerative - Primary    Of L  knee with bulging mensici and active inflammation of medial joint space seen on [place]. Likely exacerbated by lots of walking on vacation and compensation for foot pain. Will trial conservative measures, meloxicam. Counseled on low impact exercises and ongoing efforts with weight loss. F/u prn.        Relevant Medications   meloxicam (MOBIC) 7.5 MG tablet     No follow-ups on file.        Myles Gip, [place] [phone] (phone) [phone] (fax)  [place]

## 2022-04-10 ENCOUNTER — Encounter: Payer: Self-pay | Admitting: Family Medicine

## 2022-04-10 ENCOUNTER — Ambulatory Visit (INDEPENDENT_AMBULATORY_CARE_PROVIDER_SITE_OTHER): Payer: Medicare Other | Admitting: Family Medicine

## 2022-04-10 VITALS — BP 150/82 | HR 68 | Temp 98.1°F | Resp 16 | Ht 65.0 in | Wt 207.4 lb

## 2022-04-10 DIAGNOSIS — M1712 Unilateral primary osteoarthritis, left knee: Secondary | ICD-10-CM

## 2022-04-10 MED ORDER — MELOXICAM 7.5 MG PO TABS: 7.5000 mg | ORAL_TABLET | Freq: Every day | ORAL | 0 refills | Status: DC

## 2022-04-10 NOTE — Assessment & Plan Note (Signed)
Of L knee with bulging mensici and active inflammation of medial joint space seen on [place]. Likely exacerbated by lots of walking on vacation and compensation for foot pain. Will trial conservative measures, meloxicam. Counseled on low impact exercises and ongoing efforts with weight loss. F/u prn.

## 2022-04-23 DIAGNOSIS — M1712 Unilateral primary osteoarthritis, left knee: Secondary | ICD-10-CM | POA: Diagnosis not present

## 2022-04-23 DIAGNOSIS — S83412A Sprain of medial collateral ligament of left knee, initial encounter: Secondary | ICD-10-CM | POA: Diagnosis not present

## 2022-05-10 NOTE — Progress Notes (Unsigned)
      Established patient visit   Patient: Sharon Parker   DOB: 1956-10-21   66 y.o. Female  MRN: [id number] Visit Date: 05/11/2022  Today's healthcare provider: Lavon Paganini, MD   No chief complaint on file.  Subjective    HPI  ***  Medications: Outpatient Medications Prior to Visit  Medication Sig  . amLODipine (NORVASC) 5 MG tablet TAKE 1 TABLET(5 MG) BY MOUTH DAILY  . Boswellia-Glucosamine-Vit D (OSTEO BI-FLEX ONE PER DAY) TABS Take by mouth.  . fexofenadine (ALLEGRA) 180 MG tablet Take 180 mg by mouth daily.  . fluticasone (FLONASE) 50 MCG/ACT nasal spray Place 2 sprays into both nostrils daily.  . furosemide (LASIX) 20 MG tablet TAKE 1 TABLET BY MOUTH DAILY AS  NEEDED FOR EDEMA  . meloxicam (MOBIC) 7.5 MG tablet Take 1 tablet (7.5 mg total) by mouth daily.  . Multiple Vitamin (MULTIVITAMINS PO) Take by mouth.  . rosuvastatin (CRESTOR) 5 MG tablet Take 1 tablet (5 mg total) by mouth daily.  Marland Kitchen UBIQUINOL PO Take by mouth.   No facility-administered medications prior to visit.    Review of Systems  {Labs  Heme  Chem  Endocrine  Serology  Results Review (optional):23779}   Objective    There were no vitals taken for this visit. {Show previous vital signs (optional):23777}  Physical Exam  ***  No results found for any visits on 05/11/22.  Assessment & Plan     ***  No follow-ups on file.      {provider attestation***:1}   Lavon Paganini, MD  [facility] [phone] (phone) [phone] (fax)  [place]

## 2022-05-11 ENCOUNTER — Encounter: Payer: Self-pay | Admitting: Family Medicine

## 2022-05-11 ENCOUNTER — Ambulatory Visit (INDEPENDENT_AMBULATORY_CARE_PROVIDER_SITE_OTHER): Payer: Medicare Other | Admitting: Family Medicine

## 2022-05-11 VITALS — BP 151/78 | HR 73 | Temp 98.5°F | Resp 16 | Ht 66.0 in | Wt 206.3 lb

## 2022-05-11 DIAGNOSIS — E8881 Metabolic syndrome: Secondary | ICD-10-CM | POA: Insufficient documentation

## 2022-05-11 DIAGNOSIS — R7303 Prediabetes: Secondary | ICD-10-CM

## 2022-05-11 DIAGNOSIS — E6609 Other obesity due to excess calories: Secondary | ICD-10-CM

## 2022-05-11 DIAGNOSIS — Z6833 Body mass index (BMI) 33.0-33.9, adult: Secondary | ICD-10-CM

## 2022-05-11 MED ORDER — OZEMPIC (0.25 OR 0.5 MG/DOSE) 2 MG/3ML ~~LOC~~ SOPN: 0.2500 mg | PEN_INJECTOR | SUBCUTANEOUS | 1 refills | Status: DC

## 2022-05-11 NOTE — Assessment & Plan Note (Addendum)
Discussed that we will try to get Ozempic covered for her prediabetes and metabolic syndrome as well as obesity Discussed all weight loss med options and she prefers GLP1 agonist She will check on [employer] coverage, but expressed that most medicare plans do not cover this medication for weight loss Discussed importance of healthy weight management Discussed diet and exercise  Will start Ozempic at 0.25 mg weekly and plan to increase at f/u if tolerating well - discussed potential side effects

## 2022-06-04 ENCOUNTER — Telehealth: Payer: Self-pay | Admitting: Family Medicine

## 2022-06-04 NOTE — Telephone Encounter (Signed)
Ozempic pen refill request

## 2022-06-05 ENCOUNTER — Other Ambulatory Visit: Payer: Self-pay | Admitting: Family Medicine

## 2022-06-05 DIAGNOSIS — E8881 Metabolic syndrome: Secondary | ICD-10-CM

## 2022-06-05 DIAGNOSIS — R7303 Prediabetes: Secondary | ICD-10-CM

## 2022-06-05 DIAGNOSIS — E6609 Other obesity due to excess calories: Secondary | ICD-10-CM

## 2022-06-05 MED ORDER — OZEMPIC (0.25 OR 0.5 MG/DOSE) 2 MG/3ML ~~LOC~~ SOPN: 0.5000 mg | PEN_INJECTOR | SUBCUTANEOUS | 0 refills | Status: DC

## 2022-06-13 ENCOUNTER — Ambulatory Visit: Payer: Medicare Other | Admitting: Podiatry

## 2022-06-13 DIAGNOSIS — M778 Other enthesopathies, not elsewhere classified: Secondary | ICD-10-CM

## 2022-06-13 MED ORDER — TRIAMCINOLONE ACETONIDE 40 MG/ML IJ SUSP
20.0000 mg | Freq: Once | INTRAMUSCULAR | Status: AC
  Administered 2022-06-13: 20 mg

## 2022-06-13 NOTE — Progress Notes (Signed)
She presents today states my right foot is doing fine the left 1 is starting to hurt now.  Objective: Vital signs are stable alert oriented x3.  Pulses are palpable.  There is no erythema edema cellulitis drainage or odor.  She has pain on palpation second metatarsal phalangeal joint left foot.  Assessment: Capsulitis second metatarsophalangeal joint left foot.  Plan: I injected the area today 10 mg Kenalog 5 mg Marcaine point of maximal tenderness.  Discussed appropriate shoe gear stretching exercise ice therapy and shoe gear modifications.  Follow-up with her in 1 month if necessary.

## 2022-06-14 ENCOUNTER — Other Ambulatory Visit: Payer: Self-pay | Admitting: Family Medicine

## 2022-06-14 ENCOUNTER — Ambulatory Visit (INDEPENDENT_AMBULATORY_CARE_PROVIDER_SITE_OTHER): Payer: Medicare Other

## 2022-06-14 VITALS — Wt 206.0 lb

## 2022-06-14 DIAGNOSIS — Z0000 Encounter for general adult medical examination without abnormal findings: Secondary | ICD-10-CM

## 2022-06-14 NOTE — Progress Notes (Signed)
Virtual Visit via Telephone Note  I connected with  Sharon Parker on 06/14/22 at 11:30 AM EDT by telephone and verified that I am speaking with the correct person using two identifiers.  Location: Patient: home Provider: [facility] Persons participating in the virtual visit: [place]   I discussed the limitations, risks, security and privacy concerns of performing an evaluation and management service by telephone and the availability of in person appointments. The patient expressed understanding and agreed to proceed.  Interactive audio and video telecommunications were attempted between this nurse and patient, however failed, due to patient having technical difficulties OR patient did not have access to video capability.  We continued and completed visit with audio only.  Some vital signs may be absent or patient reported.   Sharon David, LPN  Subjective:   Sharon Parker is a 66 y.o. female who presents for Medicare Annual (Subsequent) preventive examination.  Review of Systems     Cardiac Risk Factors include: advanced age (>63mn, >>87women);diabetes mellitus     Objective:    There were no vitals filed for this visit. There is no height or weight on file to calculate BMI.     06/14/2022   11:24 AM 04/30/2018    8:00 AM 02/11/2017    9:18 AM  Advanced Directives  Does Patient Have a Medical Advance Directive? No No No  Would patient like information on creating a medical advance directive? No - Patient declined No - Patient declined     Current Medications (verified) Outpatient Encounter Medications as of 06/14/2022  Medication Sig   amLODipine (NORVASC) 5 MG tablet TAKE 1 TABLET(5 MG) BY MOUTH DAILY   Boswellia-Glucosamine-Vit D (OSTEO BI-FLEX ONE PER DAY) TABS Take by mouth.   fexofenadine (ALLEGRA) 180 MG tablet Take 180 mg by mouth daily.   fluticasone (FLONASE) 50 MCG/ACT nasal spray Place 2 sprays into both nostrils daily.   furosemide  (LASIX) 20 MG tablet TAKE 1 TABLET BY MOUTH DAILY AS  NEEDED FOR EDEMA   Multiple Vitamin (MULTIVITAMINS PO) Take by mouth.   rosuvastatin (CRESTOR) 5 MG tablet Take 1 tablet (5 mg total) by mouth daily.   Semaglutide,0.25 or 0.'5MG'$ /DOS, (OZEMPIC, 0.25 OR 0.5 MG/DOSE,) 2 MG/3ML SOPN Inject 0.5 mg into the skin once a week.   UBIQUINOL PO Take by mouth.   No facility-administered encounter medications on file as of 06/14/2022.    Allergies (verified) Patient has no known allergies.   History: Past Medical History:  Diagnosis Date   Allergy    Hyperlipidemia    Hypertension    Past Surgical History:  Procedure Laterality Date   CESAREAN SECTION  1994   COLONOSCOPY  2009   COLONOSCOPY WITH PROPOFOL N/A 04/30/2018   Procedure: COLONOSCOPY WITH PROPOFOL;  Surgeon: BRobert Bellow MD;  Location: [facility] ENDOSCOPY;  Service: Endoscopy;  Laterality: N/A;   HERNIA REPAIR     NASAL SEPTUM SURGERY  1983   NECK SURGERY  2002   disc in neck replaced C5-C6   TONSILLECTOMY     TONSILLECTOMY AND ADENOIDECTOMY  1970   Family History  Problem Relation Age of Onset   Stroke Mother    Heart disease Mother        CHF   Lung cancer Father    Diabetes Sister    Obesity Sister    Heart attack Brother    Healthy Sister    Healthy Sister    Healthy Sister    Healthy Brother  Social History   Socioeconomic History   Marital status: Married    Spouse name: Not on file   Number of children: Not on file   Years of education: Not on file   Highest education level: Not on file  Occupational History   Not on file  Tobacco Use   Smoking status: Former    Packs/day: 1.00    Years: 25.00    Total pack years: 25.00    Types: Cigarettes    Quit date: 11/19/2011    Years since quitting: 10.5   Smokeless tobacco: Never  Vaping Use   Vaping Use: Never used  Substance and Sexual Activity   Alcohol use: No   Drug use: No   Sexual activity: Not Currently    Birth control/protection: None   Other Topics Concern   Not on file  Social History Narrative   Not on file   Social Determinants of Health   Financial Resource Strain: Low Risk  (06/14/2022)   Overall Financial Resource Strain (CARDIA)    Difficulty of Paying Living Expenses: Not hard at all  Food Insecurity: No Food Insecurity (06/14/2022)   Hunger Vital Sign    Worried About Running Out of Food in the Last Year: Never true    [place] in the Last Year: Never true  Transportation Needs: No Transportation Needs (06/14/2022)   PRAPARE - Hydrologist (Medical): No    Lack of Transportation (Non-Medical): No  Physical Activity: Insufficiently Active (06/14/2022)   Exercise Vital Sign    Days of Exercise per Week: 3 days    Minutes of Exercise per Session: 20 min  Stress: No Stress Concern Present (06/14/2022)   [place]    Feeling of Stress : Not at all  Social Connections: Moderately Integrated (06/14/2022)   Social Connection and Isolation Panel [NHANES]    Frequency of Communication with Friends and Family: More than three times a week    Frequency of Social Gatherings with Friends and Family: Three times a week    Attends Religious Services: More than 4 times per year    Active Member of Clubs or Organizations: No    Attends Archivist Meetings: Never    Marital Status: Married    Tobacco Counseling Counseling given: Not Answered   Clinical Intake:  Pre-visit preparation completed: Yes  Pain : No/denies pain     Nutritional Risks: None Diabetes: No  How often do you need to have someone help you when you read instructions, pamphlets, or other written materials from your doctor or pharmacy?: 1 - Never  Diabetic?yes Nutrition Risk Assessment:  Has the patient had any N/V/D within the last 2 months?  No  Does the patient have any non-healing wounds?  No  Has the patient had any  unintentional weight loss or weight gain?  No   Diabetes:  Is the patient diabetic?  Yes  If diabetic, was a CBG obtained today?  No  Did the patient bring in their glucometer from home?  No  How often do you monitor your CBG's? never.   Financial Strains and Diabetes Management:  Are you having any financial strains with the device, your supplies or your medication? No .  Does the patient want to be seen by Chronic Care Management for management of their diabetes?  No  Would the patient like to be referred to a Nutritionist or for Diabetic Management?  No   Diabetic Exams:  Diabetic Eye Exam: Completed no. Overdue for diabetic eye exam. Pt has been advised about the importance in completing this exam. Diabetic Foot Exam: Completed no. Pt has been advised about the importance in completing this exam.   Interpreter Needed?: No  Information entered by :: Kirke Shaggy  LPN   Activities of Daily Living    06/14/2022   11:25 AM 06/12/2022   12:29 PM  In your present state of health, do you have any difficulty performing the following activities:  Hearing? 0 0  Vision? 0 0  Difficulty concentrating or making decisions? 0 0  Walking or climbing stairs? 0 0  Dressing or bathing? 0 0  Doing errands, shopping? 0 0  Preparing Food and eating ? N N  Using the Toilet? N N  In the past six months, have you accidently leaked urine? N N  Do you have problems with loss of bowel control? N N  Managing your Medications? N N  Managing your Finances? N N  Housekeeping or managing your Housekeeping? N N    Patient Care Team: Virginia Crews, MD as PCP - General (Family Medicine) Kate Sable, MD as PCP - Cardiology (Cardiology)  Indicate any recent Medical Services you may have received from other than [facility] providers in the past year (date may be approximate).     Assessment:   This is a routine wellness examination for Timmya.  Hearing/Vision screen Hearing Screening -  Comments:: No aids Vision Screening - Comments:: Wears glasses- Dr.Woodard  Dietary issues and exercise activities discussed: Current Exercise Habits: Home exercise routine, Type of exercise: walking, Time (Minutes): 20, Frequency (Times/Week): 3, Weekly Exercise (Minutes/Week): 60, Intensity: Mild   Goals Addressed             This Visit's Progress    DIET - EAT MORE FRUITS AND VEGETABLES         Depression Screen    06/14/2022   11:23 AM 04/10/2022    9:09 AM 01/30/2022    8:13 AM 09/29/2021    9:24 AM 06/13/2021   10:17 AM 06/10/2020    8:31 AM 06/10/2020    8:30 AM  PHQ 2/9 Scores  PHQ - 2 Score 0 0 0 0 0 0 0  PHQ- 9 Score 0 0 0 0 0 1 0    Fall Risk    06/14/2022   11:25 AM 04/10/2022    9:09 AM 01/30/2022    8:13 AM 09/29/2021    9:24 AM 06/13/2021   10:16 AM  Fall Risk   Falls in the past year? 0 0 0 0 0  Number falls in past yr: 0 0 0 0 0  Injury with Fall? 0 0  0 0  Risk for fall due to : No Fall Risks  No Fall Risks No Fall Risks   Follow up Falls evaluation completed  Falls evaluation completed Falls evaluation completed     [place]:  Any stairs in or around the home? No  If so, are there any without handrails? Yes  Home free of loose throw rugs in walkways, pet beds, electrical cords, etc? Yes  Adequate lighting in your home to reduce risk of falls? Yes   ASSISTIVE DEVICES UTILIZED TO PREVENT FALLS:  Life alert? No  Use of a cane, walker or w/c? No  Grab bars in the bathroom? No  Shower chair or bench in shower? Yes  Elevated toilet  seat or a handicapped toilet? Yes     Cognitive Function:        06/14/2022   11:27 AM  6CIT Screen  What Year? 0 points  What month? 0 points  What time? 0 points  Count back from 20 0 points  Months in reverse 0 points  Repeat phrase 0 points  Total Score 0 points    Immunizations Immunization History  Administered Date(s) Administered   Fluad Quad(high Dose 65+)  08/22/2021   Influenza Split 08/24/2011, 09/20/2012   Influenza,inj,Quad PF,6+ Mos 09/19/2013, 09/02/2014, 09/10/2015, 08/25/2016, 08/24/2017, 08/12/2018, 08/15/2019   Influenza-Unspecified 08/24/2017, 08/12/2018, 08/17/2020   PFIZER Comirnaty(Gray Top)Covid-19 Tri-Sucrose Vaccine 01/14/2020, 02/04/2020, 10/17/2020   PNEUMOCOCCAL CONJUGATE-20 06/13/2021   Td 08/24/2011   Tdap 08/24/2011   Zoster Recombinat (Shingrix) 07/03/2021, 09/08/2021   Zoster, Live 08/24/2011    TDAP status: Due, Education has been provided regarding the importance of this vaccine. Advised may receive this vaccine at local pharmacy or Health Dept. Aware to provide a copy of the vaccination record if obtained from local pharmacy or Health Dept. Verbalized acceptance and understanding.  Flu Vaccine status: Up to date  Pneumococcal vaccine status: Up to date  Covid-19 vaccine status: Completed vaccines  Qualifies for Shingles Vaccine? Yes   Zostavax completed Yes   Shingrix Completed?: Yes  Screening Tests Health Maintenance  Topic Date Due   TETANUS/TDAP  08/23/2021   COVID-19 Vaccine (4 - Booster for Pfizer series) 10/15/2022 (Originally 12/12/2020)   INFLUENZA VACCINE  06/19/2022   MAMMOGRAM  10/18/2023   COLONOSCOPY (Pts 45-81yr Insurance coverage will need to be confirmed)  04/30/2028   Pneumonia Vaccine 66 Years old  Completed   DEXA SCAN  Completed   Hepatitis C Screening  Completed   Zoster Vaccines- Shingrix  Completed   HPV VACCINES  Aged Out    Health Maintenance  Health Maintenance Due  Topic Date Due   TETANUS/TDAP  08/23/2021    Colorectal cancer screening: Type of screening: Colonoscopy. Completed 04/30/18. Repeat every 10 years  Mammogram status: Completed 10/17/21. Repeat every year  Bone Density status: Completed 07/20/21. Results reflect: Bone density results: NORMAL. Repeat every 5 years.  Lung Cancer Screening: (Low Dose CT Chest recommended if Age 66-80years, 30 pack-year  currently smoking OR have quit w/in 15years.) does not qualify.   Additional Screening:  Hepatitis C Screening: does qualify; Completed 01/21/14  Vision Screening: Recommended annual ophthalmology exams for early detection of glaucoma and other disorders of the eye. Is the patient up to date with their annual eye exam?  Yes  Who is the provider or what is the name of the office in which the patient attends annual eye exams? Dr.Woodard If pt is not established with a provider, would they like to be referred to a provider to establish care? No .   Dental Screening: Recommended annual dental exams for proper oral hygiene  Community Resource Referral / Chronic Care Management: CRR required this visit?  No   CCM required this visit?  No      Plan:     I have personally reviewed and noted the following in the patient's chart:   Medical and social history Use of alcohol, tobacco or illicit drugs  Current medications and supplements including opioid prescriptions.  Functional ability and status Nutritional status Physical activity Advanced directives List of other physicians Hospitalizations, surgeries, and ER visits in previous 12 months Vitals Screenings to include cognitive, depression, and falls Referrals and appointments  In addition,  I have reviewed and discussed with patient certain preventive protocols, quality metrics, and best practice recommendations. A written personalized care plan for preventive services as well as general preventive health recommendations were provided to patient.     Sharon David, LPN   [id number]   Nurse Notes: none

## 2022-06-14 NOTE — Patient Instructions (Signed)
Sharon Parker , Thank you for taking time to come for your Medicare Wellness Visit. I appreciate your ongoing commitment to your health goals. Please review the following plan we discussed and let me know if I can assist you in the future.   Screening recommendations/referrals: Colonoscopy: 04/30/18 Mammogram: 10/17/21 Bone Density: 07/20/21 Recommended yearly ophthalmology/optometry visit for glaucoma screening and checkup Recommended yearly dental visit for hygiene and checkup  Vaccinations: Influenza vaccine: 08/22/21 Pneumococcal vaccine: 06/13/21 Tdap vaccine: Oct. 2022 per patient Shingles vaccine: Zostavax 08/24/11   Shingrix 07/03/21, 09/08/21   Covid-19:01/14/20, 02/04/20, 10/17/20  Advanced directives: no  Conditions/risks identified: none  Next appointment: Follow up in one year for your annual wellness visit 06/17/23 @ 1:45pm by phone   Preventive Care 65 Years and Older, Female Preventive care refers to lifestyle choices and visits with your health care provider that can promote health and wellness. What does preventive care include? A yearly physical exam. This is also called an annual well check. Dental exams once or twice a year. Routine eye exams. Ask your health care provider how often you should have your eyes checked. Personal lifestyle choices, including: Daily care of your teeth and gums. Regular physical activity. Eating a healthy diet. Avoiding tobacco and drug use. Limiting alcohol use. Practicing safe sex. Taking low-dose aspirin every day. Taking vitamin and mineral supplements as recommended by your health care provider. What happens during an annual well check? The services and screenings done by your health care provider during your annual well check will depend on your age, overall health, lifestyle risk factors, and family history of disease. Counseling  Your health care provider may ask you questions about your: Alcohol use. Tobacco use. Drug  use. Emotional well-being. Home and relationship well-being. Sexual activity. Eating habits. History of falls. Memory and ability to understand (cognition). Work and work Statistician. Reproductive health. Screening  You may have the following tests or measurements: Height, weight, and BMI. Blood pressure. Lipid and cholesterol levels. These may be checked every 5 years, or more frequently if you are over 26 years old. Skin check. Lung cancer screening. You may have this screening every year starting at age 20 if you have a 30-pack-year history of smoking and currently smoke or have quit within the past 15 years. Fecal occult blood test (FOBT) of the stool. You may have this test every year starting at age 45. Flexible sigmoidoscopy or colonoscopy. You may have a sigmoidoscopy every 5 years or a colonoscopy every 10 years starting at age 7. Hepatitis C blood test. Hepatitis B blood test. Sexually transmitted disease (STD) testing. Diabetes screening. This is done by checking your blood sugar (glucose) after you have not eaten for a while (fasting). You may have this done every 1-3 years. Bone density scan. This is done to screen for osteoporosis. You may have this done starting at age 82. Mammogram. This may be done every 1-2 years. Talk to your health care provider about how often you should have regular mammograms. Talk with your health care provider about your test results, treatment options, and if necessary, the need for more tests. Vaccines  Your health care provider may recommend certain vaccines, such as: Influenza vaccine. This is recommended every year. Tetanus, diphtheria, and acellular pertussis (Tdap, Td) vaccine. You may need a Td booster every 10 years. Zoster vaccine. You may need this after age 15. Pneumococcal 13-valent conjugate (PCV13) vaccine. One dose is recommended after age 96. Pneumococcal polysaccharide (PPSV23) vaccine. One dose is recommended  after age  86. Talk to your health care provider about which screenings and vaccines you need and how often you need them. This information is not intended to replace advice given to you by your health care provider. Make sure you discuss any questions you have with your health care provider. Document Released: 12/02/2015 Document Revised: 07/25/2016 Document Reviewed: 09/06/2015 Elsevier Interactive Patient Education  2017 [place] Prevention in the Home Falls can cause injuries. They can happen to people of all ages. There are many things you can do to make your home safe and to help prevent falls. What can I do on the outside of my home? Regularly fix the edges of walkways and driveways and fix any cracks. Remove anything that might make you trip as you walk through a door, such as a raised step or threshold. Trim any bushes or trees on the path to your home. Use bright outdoor lighting. Clear any walking paths of anything that might make someone trip, such as rocks or tools. Regularly check to see if handrails are loose or broken. Make sure that both sides of any steps have handrails. Any raised decks and porches should have guardrails on the edges. Have any leaves, snow, or ice cleared regularly. Use sand or salt on walking paths during winter. Clean up any spills in your garage right away. This includes oil or grease spills. What can I do in the bathroom? Use night lights. Install grab bars by the toilet and in the tub and shower. Do not use towel bars as grab bars. Use non-skid mats or decals in the tub or shower. If you need to sit down in the shower, use a plastic, non-slip stool. Keep the floor dry. Clean up any water that spills on the floor as soon as it happens. Remove soap buildup in the tub or shower regularly. Attach bath mats securely with double-sided non-slip rug tape. Do not have throw rugs and other things on the floor that can make you trip. What can I do in the  bedroom? Use night lights. Make sure that you have a light by your bed that is easy to reach. Do not use any sheets or blankets that are too big for your bed. They should not hang down onto the floor. Have a firm chair that has side arms. You can use this for support while you get dressed. Do not have throw rugs and other things on the floor that can make you trip. What can I do in the kitchen? Clean up any spills right away. Avoid walking on wet floors. Keep items that you use a lot in easy-to-reach places. If you need to reach something above you, use a strong step stool that has a grab bar. Keep electrical cords out of the way. Do not use floor polish or wax that makes floors slippery. If you must use wax, use non-skid floor wax. Do not have throw rugs and other things on the floor that can make you trip. What can I do with my stairs? Do not leave any items on the stairs. Make sure that there are handrails on both sides of the stairs and use them. Fix handrails that are broken or loose. Make sure that handrails are as long as the stairways. Check any carpeting to make sure that it is firmly attached to the stairs. Fix any carpet that is loose or worn. Avoid having throw rugs at the top or bottom of the stairs. If you  do have throw rugs, attach them to the floor with carpet tape. Make sure that you have a light switch at the top of the stairs and the bottom of the stairs. If you do not have them, ask someone to add them for you. What else can I do to help prevent falls? Wear shoes that: Do not have high heels. Have rubber bottoms. Are comfortable and fit you well. Are closed at the toe. Do not wear sandals. If you use a stepladder: Make sure that it is fully opened. Do not climb a closed stepladder. Make sure that both sides of the stepladder are locked into place. Ask someone to hold it for you, if possible. Clearly mark and make sure that you can see: Any grab bars or  handrails. First and last steps. Where the edge of each step is. Use tools that help you move around (mobility aids) if they are needed. These include: Canes. Walkers. Scooters. Crutches. Turn on the lights when you go into a dark area. Replace any light bulbs as soon as they burn out. Set up your furniture so you have a clear path. Avoid moving your furniture around. If any of your floors are uneven, fix them. If there are any pets around you, be aware of where they are. Review your medicines with your doctor. Some medicines can make you feel dizzy. This can increase your chance of falling. Ask your doctor what other things that you can do to help prevent falls. This information is not intended to replace advice given to you by your health care provider. Make sure you discuss any questions you have with your health care provider. Document Released: 09/01/2009 Document Revised: 04/12/2016 Document Reviewed: 12/10/2014 Elsevier Interactive Patient Education  2017 [employer].

## 2022-06-15 NOTE — Telephone Encounter (Signed)
Requested Prescriptions  Pending Prescriptions Disp Refills  . rosuvastatin (CRESTOR) 5 MG tablet [Pharmacy Med Name: Rosuvastatin Calcium 5 MG Oral Tablet] 100 tablet 0    Sig: TAKE 1 TABLET BY MOUTH DAILY     Cardiovascular:  Antilipid - Statins 2 Failed - 06/14/2022  8:46 AM      Failed - Lipid Panel in normal range within the last 12 months    Cholesterol, Total  Date Value Ref Range Status  01/30/2022 203 (H) 100 - 199 mg/dL Final   LDL Chol Calc (NIH)  Date Value Ref Range Status  01/30/2022 108 (H) 0 - 99 mg/dL Final   HDL  Date Value Ref Range Status  01/30/2022 56 >39 mg/dL Final   Triglycerides  Date Value Ref Range Status  01/30/2022 227 (H) 0 - 149 mg/dL Final         Passed - Cr in normal range and within 360 days    Creatinine, Ser  Date Value Ref Range Status  01/30/2022 0.75 0.57 - 1.00 mg/dL Final         Passed - Patient is not pregnant      Passed - Valid encounter within last 12 months    Recent Outpatient Visits          1 month ago Class 1 obesity due to excess calories with serious comorbidity and body mass index (BMI) of 33.0 to 33.9 in adult   [facility], Dionne Bucy, MD   2 months ago Osteoarthritis of left knee, unspecified osteoarthritis type   [facility] Myles Gip, DO   4 months ago Essential hypertension   [facility], Dionne Bucy, MD   8 months ago Essential hypertension   [place], Dionne Bucy, MD   11 months ago Essential hypertension   [place], Dionne Bucy, MD      Future Appointments            In 1 week [place], Jake Church, DO [employer], [place]   In 2 weeks Bacigalupo, Dionne Bucy, MD [facility]           . amLODipine (NORVASC) 5 MG tablet [Pharmacy Med Name: amLODIPine Besylate 5 MG Oral Tablet] 100 tablet 0    Sig: TAKE 1 TABLET BY MOUTH DAILY     Cardiovascular:  Calcium Channel Blockers 2 Failed - 06/14/2022  8:46 AM      Failed - Last BP in normal range    BP Readings from Last 1 Encounters:  05/11/22 (!) 151/78         Passed - Last Heart Rate in normal range    Pulse Readings from Last 1 Encounters:  05/11/22 73         Passed - Valid encounter within last 6 months    Recent Outpatient Visits          1 month ago Class 1 obesity due to excess calories with serious comorbidity and body mass index (BMI) of 33.0 to 33.9 in adult   [facility], Dionne Bucy, MD   2 months ago Osteoarthritis of left knee, unspecified osteoarthritis type   [facility] Myles Gip, DO   4 months ago Essential hypertension   [facility], Dionne Bucy, MD   8 months ago Essential hypertension   [place], Dionne Bucy, MD   11 months ago Essential hypertension   [facility] Bacigalupo, Dionne Bucy, MD      Future Appointments            In 1 week [place], Jake Church, DO [facility], [place]   In 2 weeks Brita Romp, Dionne Bucy, MD [facility], [place]

## 2022-06-25 ENCOUNTER — Ambulatory Visit: Payer: Medicare Other | Admitting: Family Medicine

## 2022-07-02 ENCOUNTER — Encounter: Payer: Medicare Other | Admitting: Family Medicine

## 2022-07-04 ENCOUNTER — Encounter: Payer: Self-pay | Admitting: Family Medicine

## 2022-07-04 ENCOUNTER — Ambulatory Visit (INDEPENDENT_AMBULATORY_CARE_PROVIDER_SITE_OTHER): Payer: Medicare Other | Admitting: Family Medicine

## 2022-07-04 ENCOUNTER — Ambulatory Visit: Payer: Medicare Other | Admitting: Podiatry

## 2022-07-04 VITALS — BP 131/76 | HR 69 | Temp 98.0°F | Resp 16 | Ht 66.0 in | Wt 195.2 lb

## 2022-07-04 DIAGNOSIS — Z87891 Personal history of nicotine dependence: Secondary | ICD-10-CM | POA: Diagnosis not present

## 2022-07-04 DIAGNOSIS — I10 Essential (primary) hypertension: Secondary | ICD-10-CM | POA: Diagnosis not present

## 2022-07-04 DIAGNOSIS — R7303 Prediabetes: Secondary | ICD-10-CM | POA: Diagnosis not present

## 2022-07-04 DIAGNOSIS — E7800 Pure hypercholesterolemia, unspecified: Secondary | ICD-10-CM | POA: Diagnosis not present

## 2022-07-04 DIAGNOSIS — J302 Other seasonal allergic rhinitis: Secondary | ICD-10-CM

## 2022-07-04 DIAGNOSIS — Z0000 Encounter for general adult medical examination without abnormal findings: Secondary | ICD-10-CM

## 2022-07-04 DIAGNOSIS — E6609 Other obesity due to excess calories: Secondary | ICD-10-CM

## 2022-07-04 DIAGNOSIS — E038 Other specified hypothyroidism: Secondary | ICD-10-CM

## 2022-07-04 DIAGNOSIS — E8881 Metabolic syndrome: Secondary | ICD-10-CM

## 2022-07-04 DIAGNOSIS — Z6831 Body mass index (BMI) 31.0-31.9, adult: Secondary | ICD-10-CM

## 2022-07-04 MED ORDER — SEMAGLUTIDE (1 MG/DOSE) 4 MG/3ML ~~LOC~~ SOPN: 1.0000 mg | PEN_INJECTOR | SUBCUTANEOUS | 1 refills | Status: DC

## 2022-07-04 MED ORDER — ROSUVASTATIN CALCIUM 5 MG PO TABS
5.0000 mg | ORAL_TABLET | Freq: Every day | ORAL | 3 refills | Status: DC
Start: 2022-07-04 — End: 2022-07-04

## 2022-07-04 MED ORDER — ROSUVASTATIN CALCIUM 5 MG PO TABS: 5.0000 mg | ORAL_TABLET | Freq: Every day | ORAL | 3 refills | Status: DC

## 2022-07-04 MED ORDER — AMLODIPINE BESYLATE 5 MG PO TABS: 5.0000 mg | ORAL_TABLET | Freq: Every day | ORAL | 3 refills | Status: DC

## 2022-07-04 NOTE — Progress Notes (Signed)
BP 131/76 (BP Location: Left Arm, Patient Position: Sitting, Cuff Size: Large)   Pulse 69   Temp 98 F (36.7 C) (Oral)   Resp 16   Ht '5\' 6"'$  (1.676 m)   Wt 195 lb 3.2 oz (88.5 kg)   BMI 31.51 kg/m    Subjective:    Patient ID: Sharon Parker, female    DOB: [birth year], 66 y.o.   MRN: [id number]  HPI: Sharon Parker is a 66 y.o. female presenting on 07/04/2022 for comprehensive medical examination. Current medical complaints include:none  Hypertension: - Medications: lasix prn, amlodipine - Compliance: good - Checking BP at home: yes, 130s SBP - Denies any SOB, CP, vision changes, medication SEs, or symptoms of hypotension  OBESITY - Meds: ozempic - Previously on none - Complications of obesity: prediabetes, HLD, HTN - Peak weight: 213lb - Weight loss goal: 155lb - Weight loss to date: 12lb - Diet: 2-3 meals per day.  - Exercise: walking more  Menopausal Symptoms: no  Depression Screen done today and results listed below:     06/14/2022   11:23 AM 04/10/2022    9:09 AM 01/30/2022    8:13 AM 09/29/2021    9:24 AM 06/13/2021   10:17 AM  Depression screen PHQ 2/9  Decreased Interest 0 0 0 0 0  Down, Depressed, Hopeless 0 0 0 0 0  PHQ - 2 Score 0 0 0 0 0  Altered sleeping 0 0 0 0 0  Tired, decreased energy 0 0 0 0 0  Change in appetite 0 0 0 0 0  Feeling bad or failure about yourself  0 0 0 0 0  Trouble concentrating 0 0 0 0 0  Moving slowly or fidgety/restless 0 0 0 0 0  Suicidal thoughts 0 0 0 0 0  PHQ-9 Score 0 0 0 0 0  Difficult doing work/chores Not difficult at all Not difficult at all Not difficult at all Not difficult at all Not difficult at all    Past Medical History:  Past Medical History:  Diagnosis Date   Allergy    Hyperlipidemia    Hypertension     Surgical History:  Past Surgical History:  Procedure Laterality Date   [place]   COLONOSCOPY  2009   COLONOSCOPY WITH PROPOFOL N/A 04/30/2018   Procedure: COLONOSCOPY WITH  PROPOFOL;  Surgeon: Robert Bellow, MD;  Location: [facility] ENDOSCOPY;  Service: Endoscopy;  Laterality: N/A;   HERNIA REPAIR     NASAL SEPTUM SURGERY  1983   NECK SURGERY  2002   disc in neck replaced C5-C6   TONSILLECTOMY     TONSILLECTOMY AND ADENOIDECTOMY  1970    Medications:  Current Outpatient Medications on File Prior to Visit  Medication Sig   Boswellia-Glucosamine-Vit D (OSTEO BI-FLEX ONE PER DAY) TABS Take by mouth.   fexofenadine (ALLEGRA) 180 MG tablet Take 180 mg by mouth daily.   fluticasone (FLONASE) 50 MCG/ACT nasal spray Place 2 sprays into both nostrils daily.   furosemide (LASIX) 20 MG tablet TAKE 1 TABLET BY MOUTH DAILY AS  NEEDED FOR EDEMA   Multiple Vitamin (MULTIVITAMINS PO) Take by mouth.   UBIQUINOL PO Take by mouth.   No current facility-administered medications on file prior to visit.    Allergies:  No Known Allergies  Social History:  Social History   Socioeconomic History   Marital status: Married    Spouse name: Not on file   Number of children: Not on file  Years of education: Not on file   Highest education level: Not on file  Occupational History   Not on file  Tobacco Use   Smoking status: Former    Packs/day: 1.00    Years: 25.00    Total pack years: 25.00    Types: Cigarettes    Quit date: 11/19/2011    Years since quitting: 10.6   Smokeless tobacco: Never  Vaping Use   Vaping Use: Never used  Substance and Sexual Activity   Alcohol use: No   Drug use: No   Sexual activity: Not Currently    Birth control/protection: None  Other Topics Concern   Not on file  Social History Narrative   Not on file   Social Determinants of Health   Financial Resource Strain: Low Risk  (06/14/2022)   Overall Financial Resource Strain (CARDIA)    Difficulty of Paying Living Expenses: Not hard at all  Food Insecurity: No Food Insecurity (06/14/2022)   Hunger Vital Sign    Worried About Running Out of Food in the Last Year: Never true     Ran Out of Food in the Last Year: Never true  Transportation Needs: No Transportation Needs (06/14/2022)   PRAPARE - Administrator, Civil Service (Medical): No    Lack of Transportation (Non-Medical): No  Physical Activity: Insufficiently Active (06/14/2022)   Exercise Vital Sign    Days of Exercise per Week: 3 days    Minutes of Exercise per Session: 20 min  Stress: No Stress Concern Present (06/14/2022)   [employer] of Occupational Health - Occupational Stress Questionnaire    Feeling of Stress : Not at all  Social Connections: Moderately Integrated (06/14/2022)   Social Connection and Isolation Panel [NHANES]    Frequency of Communication with Friends and Family: More than three times a week    Frequency of Social Gatherings with Friends and Family: Three times a week    Attends Religious Services: More than 4 times per year    Active Member of Clubs or Organizations: No    Attends Banker Meetings: Never    Marital Status: Married  Catering manager Violence: Not At Risk (06/14/2022)   Humiliation, Afraid, Rape, and Kick questionnaire    Fear of Current or Ex-Partner: No    Emotionally Abused: No    Physically Abused: No    Sexually Abused: No   Social History   Tobacco Use  Smoking Status Former   Packs/day: 1.00   Years: 25.00   Total pack years: 25.00   Types: Cigarettes   Quit date: 11/19/2011   Years since quitting: 10.6  Smokeless Tobacco Never   Social History   Substance and Sexual Activity  Alcohol Use No    Family History:  Family History  Problem Relation Age of Onset   Stroke Mother    Heart disease Mother        CHF   Lung cancer Father    Diabetes Sister    Obesity Sister    Heart attack Brother    Healthy Sister    Healthy Sister    Healthy Sister    Healthy Brother     Past medical history, surgical history, medications, allergies, family history and social history reviewed with patient today and changes made  to appropriate areas of the chart.      Objective:    BP 131/76 (BP Location: Left Arm, Patient Position: Sitting, Cuff Size: Large)   Pulse 69  Temp 98 F (36.7 C) (Oral)   Resp 16   Ht $R'5\' 6"'wi$  (1.676 m)   Wt 195 lb 3.2 oz (88.5 kg)   BMI 31.51 kg/m   Wt Readings from Last 3 Encounters:  07/04/22 195 lb 3.2 oz (88.5 kg)  06/14/22 206 lb (93.4 kg)  05/11/22 206 lb 4.8 oz (93.6 kg)    Physical Exam Vitals reviewed.  HENT:     Right Ear: Tympanic membrane, ear canal and external ear normal.     Left Ear: Tympanic membrane, ear canal and external ear normal.     Nose: Nose normal.     Mouth/Throat:     Mouth: Mucous membranes are moist.     Pharynx: Oropharynx is clear.  Eyes:     Extraocular Movements: Extraocular movements intact.     Pupils: Pupils are equal, round, and reactive to light.  Cardiovascular:     Rate and Rhythm: Normal rate and regular rhythm.     Heart sounds: Normal heart sounds. No murmur heard. Pulmonary:     Effort: Pulmonary effort is normal.     Breath sounds: Normal breath sounds.  Abdominal:     General: Bowel sounds are normal.     Palpations: Abdomen is soft.     Tenderness: There is no abdominal tenderness.  Musculoskeletal:        General: Normal range of motion.     Right lower leg: No edema.     Left lower leg: No edema.  Lymphadenopathy:     Cervical: No cervical adenopathy.  Skin:    General: Skin is warm and dry.  Neurological:     Mental Status: She is alert and oriented to person, place, and time. Mental status is at baseline.     Gait: Gait normal.  Psychiatric:        Mood and Affect: Mood normal.        Behavior: Behavior normal.     Results for orders placed or performed in visit on 01/30/22  Hemoglobin A1c  Result Value Ref Range   Hgb A1c MFr Bld 5.7 (H) 4.8 - 5.6 %   Est. average glucose Bld gHb Est-mCnc 117 mg/dL  Comprehensive metabolic panel  Result Value Ref Range   Glucose 112 (H) 70 - 99 mg/dL   BUN 12 8 -  27 mg/dL   Creatinine, Ser 0.75 0.57 - 1.00 mg/dL   eGFR 88 >59 mL/min/1.73   BUN/Creatinine Ratio 16 12 - 28   Sodium 142 134 - 144 mmol/L   Potassium 4.4 3.5 - 5.2 mmol/L   Chloride 103 96 - 106 mmol/L   CO2 25 20 - 29 mmol/L   Calcium 9.3 8.7 - 10.3 mg/dL   Total Protein 6.8 6.0 - 8.5 g/dL   Albumin 4.5 3.8 - 4.8 g/dL   Globulin, Total 2.3 1.5 - 4.5 g/dL   Albumin/Globulin Ratio 2.0 1.2 - 2.2   Bilirubin Total 0.4 0.0 - 1.2 mg/dL   Alkaline Phosphatase 97 44 - 121 IU/L   AST 15 0 - 40 IU/L   ALT 15 0 - 32 IU/L  Lipid panel  Result Value Ref Range   Cholesterol, Total 203 (H) 100 - 199 mg/dL   Triglycerides 227 (H) 0 - 149 mg/dL   HDL 56 >39 mg/dL   VLDL Cholesterol Cal 39 5 - 40 mg/dL   LDL Chol Calc (NIH) 108 (H) 0 - 99 mg/dL   Chol/HDL Ratio 3.6 0.0 - 4.4 ratio  TSH +  free T4  Result Value Ref Range   TSH 2.610 0.450 - 4.500 uIU/mL   Free T4 1.12 0.82 - 1.77 ng/dL      Assessment & Plan:   Problem List Items Addressed This Visit       Cardiovascular and Mediastinum   Essential hypertension    Doing well on current regimen, no changes made today. Reviewed recent labs.      Relevant Medications   amLODipine (NORVASC) 5 MG tablet   rosuvastatin (CRESTOR) 5 MG tablet     Respiratory   Allergic rhinitis    Doing well on current regimen, no changes made today.        Endocrine   Subclinical hypothyroidism    Asymptomatic. Recent labs within goal range.        Other   Class 1 obesity due to excess calories with serious comorbidity and body mass index (BMI) of 31.0 to 31.9 in adult    Doing well on ozempic, increase to $RemoveBef'1mg'rfAQORMEol$  dose. F/u 3 months. Continue diet and exercise.       Relevant Medications   Semaglutide, 1 MG/DOSE, 4 MG/3ML SOPN   Other Relevant Orders   Hemoglobin A1c   Lipid panel   History of smoking 25-50 pack years   Hypercholesteremia    Tolerant of statin, continue. Recheck labs. Continue weight loss efforts.      Relevant Medications    amLODipine (NORVASC) 5 MG tablet   rosuvastatin (CRESTOR) 5 MG tablet   Other Relevant Orders   Lipid panel   Metabolic syndrome   Relevant Medications   Semaglutide, 1 MG/DOSE, 4 MG/3ML SOPN   Other Relevant Orders   Hemoglobin A1c   Lipid panel   Prediabetes   Relevant Medications   Semaglutide, 1 MG/DOSE, 4 MG/3ML SOPN   rosuvastatin (CRESTOR) 5 MG tablet   Other Relevant Orders   Hemoglobin A1c   Other Visit Diagnoses     Annual physical exam    -  Primary   Relevant Orders   Hepatitis C antibody   Hemoglobin A1c   CBC with Differential   Lipid panel        Follow up plan: Return in about 3 months (around 10/04/2022) for obesity.   LABORATORY TESTING:  - Pap smear: not applicable  IMMUNIZATIONS:   - Tdap: Tetanus vaccination status reviewed: last tetanus booster within 10 years. - Influenza: Postponed to flu season - Pneumococcal: Up to date - HPV: Not applicable - Shingrix vaccine: Up to date - COVID vaccine: has received 3 doses of mRNA vaccine  SCREENING: - Mammogram: Up to date  - Colonoscopy: Up to date  - Bone Density: Up to date  - Lung Cancer Screening: declines  Hep C Screening: due STD testing and prevention (HIV/chl/gon/syphilis): no concerns Sexual History: currently abstinent Menstrual History/LMP/Abnormal Bleeding: post-menopausal Incontinence Symptoms: none  Osteoporosis: Discussed high calcium and vitamin D supplementation, weight bearing exercises  Advanced Care Planning: A voluntary discussion about advance care planning including the explanation and discussion of advance directives.  Discussed health care proxy and Living will, and the patient was able to identify a health care proxy as husband, Reine Bristow.  Patient does not have a living will at present time. If patient does have living will, I have requested they bring this to the clinic to be scanned in to their chart.  PATIENT COUNSELING:   Advised to take 1 mg of folate  supplement per day if capable of pregnancy.   Sexuality: Discussed  sexually transmitted diseases, partner selection, use of condoms, avoidance of unintended pregnancy  and contraceptive alternatives.   Advised to avoid cigarette smoking.  I discussed with the patient that most people either abstain from alcohol or drink within safe limits (<=14/week and <=4 drinks/occasion for males, <=7/weeks and <= 3 drinks/occasion for females) and that the risk for alcohol disorders and other health effects rises proportionally with the number of drinks per week and how often a drinker exceeds daily limits.  Discussed cessation/primary prevention of drug use and availability of treatment for abuse.   Diet: Encouraged to adjust caloric intake to maintain  or achieve ideal body weight, to reduce intake of dietary saturated fat and total fat, to limit sodium intake by avoiding high sodium foods and not adding table salt, and to maintain adequate dietary potassium and calcium preferably from fresh fruits, vegetables, and low-fat dairy products.    Stressed the importance of regular exercise  Injury prevention: Discussed safety belts, safety helmets, smoke detector, smoking near bedding or upholstery.   Dental health: Discussed importance of regular tooth brushing, flossing, and dental visits.    NEXT PREVENTATIVE PHYSICAL DUE IN 1 YEAR. Return in about 3 months (around 10/04/2022) for obesity.

## 2022-07-04 NOTE — Assessment & Plan Note (Signed)
Doing well on current regimen, no changes made today. 

## 2022-07-04 NOTE — Assessment & Plan Note (Signed)
Asymptomatic. Recent labs within goal range.

## 2022-07-04 NOTE — Patient Instructions (Signed)

## 2022-07-04 NOTE — Assessment & Plan Note (Signed)
Tolerant of statin, continue. Recheck labs. Continue weight loss efforts.

## 2022-07-04 NOTE — Assessment & Plan Note (Signed)
Doing well on ozempic, increase to '1mg'$  dose. F/u 3 months. Continue diet and exercise.

## 2022-07-04 NOTE — Assessment & Plan Note (Signed)
Doing well on current regimen, no changes made today. Reviewed recent labs.

## 2022-07-05 LAB — CBC WITH DIFFERENTIAL/PLATELET
Basophils Absolute: 0.1 10*3/uL (ref 0.0–0.2)
Basos: 2 %
EOS (ABSOLUTE): 0.2 10*3/uL (ref 0.0–0.4)
Eos: 3 %
Hematocrit: 40.9 % (ref 34.0–46.6)
Hemoglobin: 13.9 g/dL (ref 11.1–15.9)
Immature Grans (Abs): 0 10*3/uL (ref 0.0–0.1)
Immature Granulocytes: 0 %
Lymphocytes Absolute: 2 10*3/uL (ref 0.7–3.1)
Lymphs: 33 %
MCH: 31.1 pg (ref 26.6–33.0)
MCHC: 34 g/dL (ref 31.5–35.7)
MCV: 92 fL (ref 79–97)
Monocytes Absolute: 0.5 10*3/uL (ref 0.1–0.9)
Monocytes: 8 %
Neutrophils Absolute: 3.4 10*3/uL (ref 1.4–7.0)
Neutrophils: 54 %
Platelets: 250 10*3/uL (ref 150–450)
RBC: 4.47 x10E6/uL (ref 3.77–5.28)
RDW: 13.2 % (ref 11.7–15.4)
WBC: 6.2 10*3/uL (ref 3.4–10.8)

## 2022-07-05 LAB — LIPID PANEL
Chol/HDL Ratio: 2.9 ratio (ref 0.0–4.4)
Cholesterol, Total: 173 mg/dL (ref 100–199)
HDL: 59 mg/dL (ref >39)
LDL Chol Calc (NIH): 92 mg/dL (ref 0–99)
Triglycerides: 122 mg/dL (ref 0–149)
VLDL Cholesterol Cal: 22 mg/dL (ref 5–40)

## 2022-07-05 LAB — HEMOGLOBIN A1C
Est. average glucose Bld gHb Est-mCnc: 114 mg/dL
Hgb A1c MFr Bld: 5.6 % (ref 4.8–5.6)

## 2022-07-05 LAB — HEPATITIS C ANTIBODY: Hep C Virus Ab: NONREACTIVE

## 2022-08-05 IMAGING — MG MM DIGITAL DIAGNOSTIC UNILAT*R* W/ TOMO W/ CAD
4 series · 4 of 12 positions shown · non-contrast
Comparison: Previous exam(s).

CLINICAL DATA: Short-term follow-up for probably benign right
breast masses, initially assessed on 09/21/2020 as a recall from
screening.

EXAM:
DIGITAL DIAGNOSTIC UNILATERAL RIGHT MAMMOGRAM WITH TOMOSYNTHESIS AND
CAD; ULTRASOUND RIGHT BREAST LIMITED
TECHNIQUE: Right digital diagnostic mammography and breast tomosynthesis was
performed. The images were evaluated with computer-aided detection.;
Targeted ultrasound examination of the right breast was performed

[R MLO synth-2D]
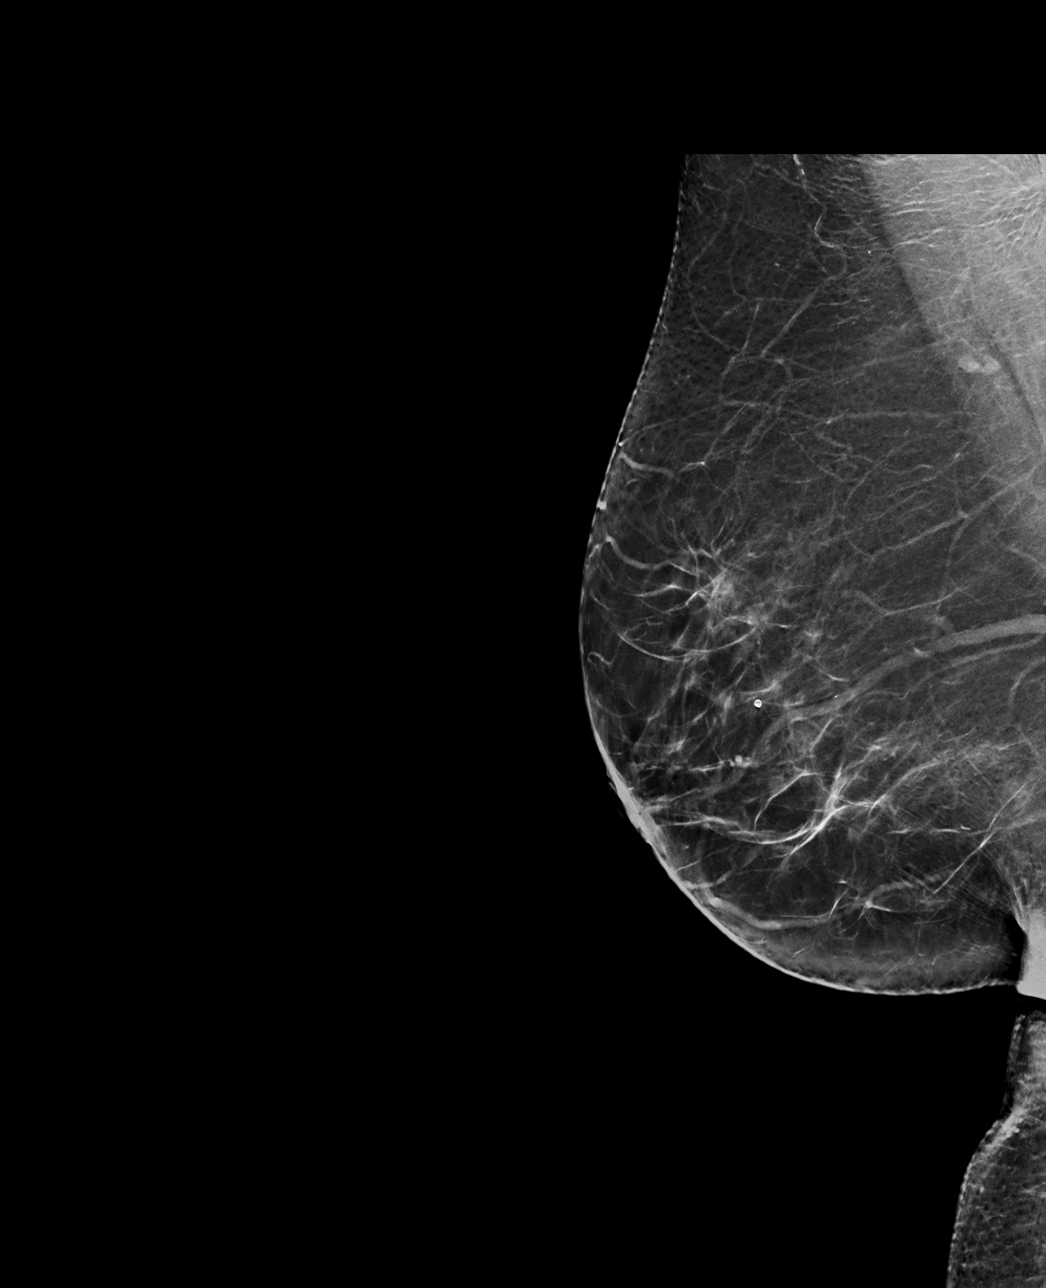

[R CC synth-2D]
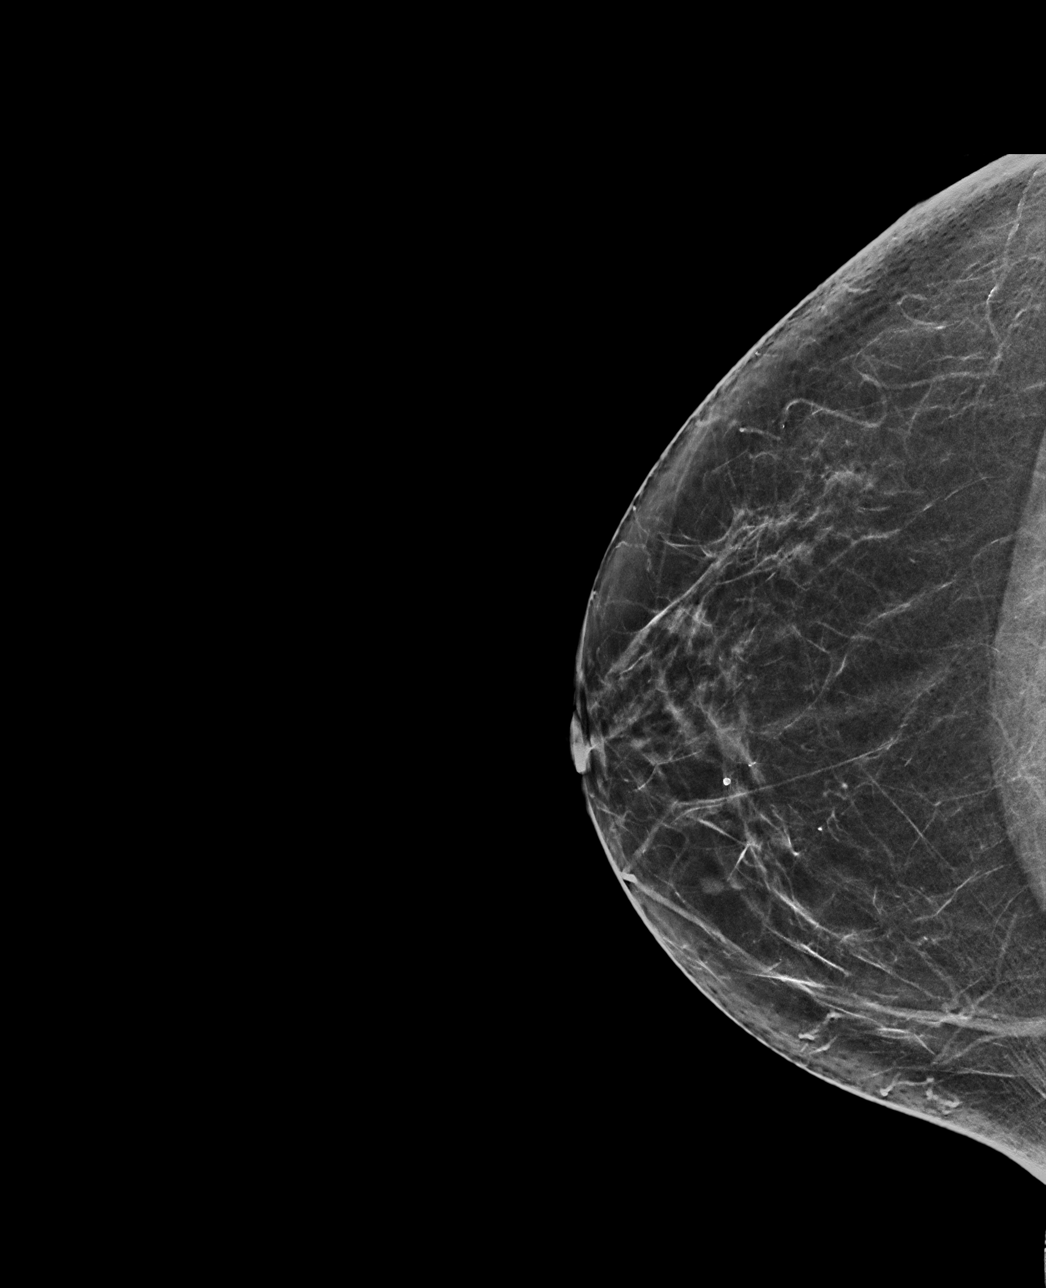

[R MLO tomo · tomo slice 37/74.0]
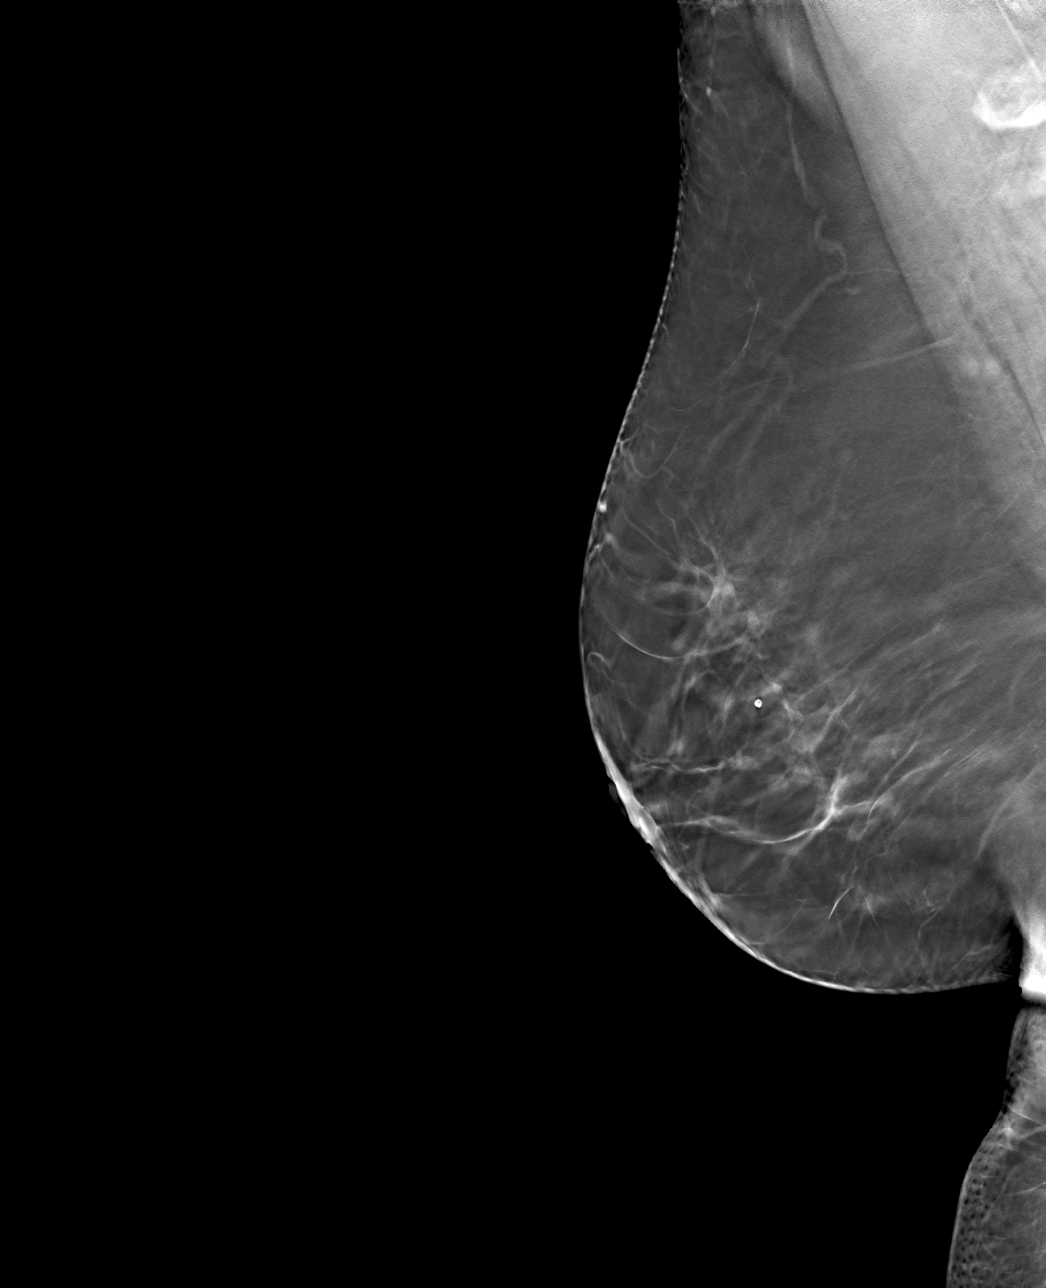

[R CC tomo · tomo slice 33/66.0]
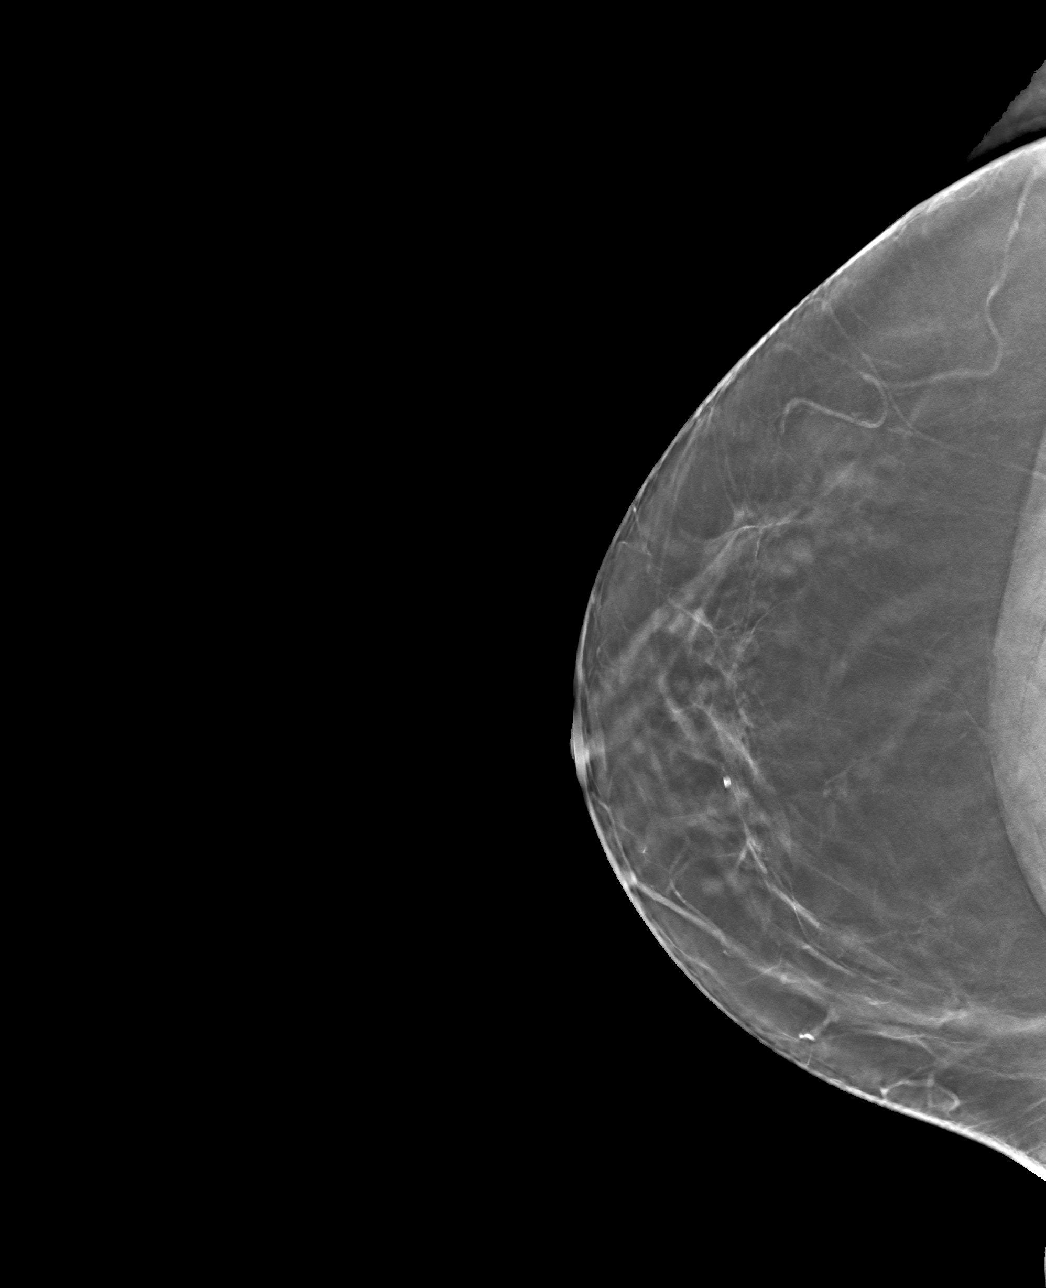

[4 of 12 positions shown; findings below may reference images not displayed]

ACR Breast Density Category b: There are scattered areas of
fibroglandular density.
FINDINGS: The small mass noted in the right breast on the exam dated
09/14/2020 is less apparent. There are no new masses or areas of new
asymmetry. There are no areas of architectural distortion and there
are no suspicious calcifications.

Targeted right breast ultrasound is performed, showing several small
hypoechoic lesions. At 10 o'clock, 2 cm the nipple, there is a small
lesion with partly ill-defined margins measuring 3 x 2 x 2 mm,
previously 5 x 3 x 4 mm. Also, at 10 o'clock, 2 cm the nipple, there
is a more elongated hypo to anechoic lesion measuring 4 x 1 x 3 mm,
not evident on the prior study. The small lesion seen at 1 o'clock,
2 cm from the nipple is not reproduced on the current exam.
IMPRESSION: 1. Several small probably benign masses consistent with complicated
cysts in the right breast, the largest of which shows a decrease in
size from the prior exam.

RECOMMENDATION:
Diagnostic mammography and right breast ultrasound in 6 months.

I have discussed the findings and recommendations with the patient.
If applicable, a reminder letter will be sent to the patient
regarding the next appointment.

BI-RADS CATEGORY  3: Probably benign.

## 2022-08-26 ENCOUNTER — Other Ambulatory Visit: Payer: Self-pay | Admitting: Family Medicine

## 2022-09-07 ENCOUNTER — Other Ambulatory Visit: Payer: Self-pay | Admitting: Family Medicine

## 2022-09-07 DIAGNOSIS — E8881 Metabolic syndrome: Secondary | ICD-10-CM

## 2022-09-07 DIAGNOSIS — E6609 Other obesity due to excess calories: Secondary | ICD-10-CM

## 2022-09-07 DIAGNOSIS — N63 Unspecified lump in unspecified breast: Secondary | ICD-10-CM

## 2022-09-07 DIAGNOSIS — R7303 Prediabetes: Secondary | ICD-10-CM

## 2022-09-07 NOTE — Telephone Encounter (Signed)
Requested Prescriptions  Pending Prescriptions Disp Refills  . OZEMPIC, 1 MG/DOSE, 4 MG/3ML SOPN [Pharmacy Med Name: OZEMPIC '1MG'$  PER DOSE (1X'4MG'$  PEN)] 3 mL 1    Sig: INJECT 1 MG UNDER THE SKIN ONE DAY A WEEK     Endocrinology:  Diabetes - GLP-1 Receptor Agonists - semaglutide Passed - 09/07/2022  2:06 PM      Passed - HBA1C in normal range and within 180 days    Hgb A1c MFr Bld  Date Value Ref Range Status  07/04/2022 5.6 4.8 - 5.6 % Final    Comment:             Prediabetes: 5.7 - 6.4          Diabetes: >6.4          Glycemic control for adults with diabetes: <7.0          Passed - Cr in normal range and within 360 days    Creatinine, Ser  Date Value Ref Range Status  01/30/2022 0.75 0.57 - 1.00 mg/dL Final         Passed - Valid encounter within last 6 months    Recent Outpatient Visits          2 months ago Annual physical exam   [facility] Myles Gip, DO   3 months ago Class 1 obesity due to excess calories with serious comorbidity and body mass index (BMI) of 33.0 to 33.9 in adult   [facility], Dionne Bucy, MD   5 months ago Osteoarthritis of left knee, unspecified osteoarthritis type   [facility] Myles Gip, DO   7 months ago Essential hypertension   [facility] [place], Dionne Bucy, MD   11 months ago Essential hypertension   [place], Dionne Bucy, MD      Future Appointments            In 1 month Bacigalupo, Dionne Bucy, MD [facility], [facility]

## 2022-10-09 NOTE — Progress Notes (Deleted)
I, S ,acting as a scribe for Lavon Paganini, MD.,have documented all relevant documentation on the behalf of Lavon Paganini, MD,as directed by  Lavon Paganini, MD while in the presence of Lavon Paganini, MD.     Established patient visit   Patient: Sharon Parker   DOB: 12/31/1955   66 y.o. Female  MRN: [id number] Visit Date: 10/15/2022  Today's healthcare provider: Lavon Paganini, MD   No chief complaint on file.  Subjective    HPI  Follow up for obesity  The patient was last seen for this 3 months ago. Changes made at last visit include increase Ozempic to '1mg'$  dose. Patient to continue diet and exercise.  She reports {excellent/good/fair/poor:19665} compliance with treatment. She feels that condition is {improved/worse/unchanged:3041574}. She {is/is not:21021397} having side effects. ***  ----------------------------------------------------------------------------------------- Prediabetes, Follow-up  Lab Results  Component Value Date   HGBA1C 5.6 07/04/2022   HGBA1C 5.7 (H) 01/30/2022   HGBA1C 5.7 (H) 06/27/2021   GLUCOSE 112 (H) 01/30/2022   GLUCOSE 105 (H) 09/29/2021   GLUCOSE 94 06/27/2021    Last seen for for this3 months ago.  Management since that visit includes increase Ozempic to '1mg'$  weekly. Current symptoms include {Symptoms; diabetes:14075} and have been {Desc; course:15616}.  Prior visit with dietician: {yes/no:17258} Current diet: {diet habits:16563} Current exercise: {exercise types:16438}  Pertinent Labs:    Component Value Date/Time   CHOL 173 07/04/2022 0934   TRIG 122 07/04/2022 0934   CHOLHDL 2.9 07/04/2022 0934   CREATININE 0.75 01/30/2022 0829    Wt Readings from Last 3 Encounters:  07/04/22 195 lb 3.2 oz (88.5 kg)  06/14/22 206 lb (93.4 kg)  05/11/22 206 lb 4.8 oz (93.6 kg)    -----------------------------------------------------------------------------------------   Medications: Outpatient  Medications Prior to Visit  Medication Sig   amLODipine (NORVASC) 5 MG tablet Take 1 tablet (5 mg total) by mouth daily.   Boswellia-Glucosamine-Vit D (OSTEO BI-FLEX ONE PER DAY) TABS Take by mouth.   fexofenadine (ALLEGRA) 180 MG tablet Take 180 mg by mouth daily.   fluticasone (FLONASE) 50 MCG/ACT nasal spray Place 2 sprays into both nostrils daily.   furosemide (LASIX) 20 MG tablet TAKE 1 TABLET BY MOUTH DAILY AS  NEEDED FOR EDEMA   Multiple Vitamin (MULTIVITAMINS PO) Take by mouth.   OZEMPIC, 1 MG/DOSE, 4 MG/3ML SOPN INJECT 1 MG UNDER THE SKIN ONE DAY A WEEK   rosuvastatin (CRESTOR) 5 MG tablet Take 1 tablet (5 mg total) by mouth daily.   UBIQUINOL PO Take by mouth.   No facility-administered medications prior to visit.    Review of Systems  Constitutional:  Negative for appetite change and fatigue.  Eyes:  Negative for visual disturbance.  Respiratory:  Negative for chest tightness and shortness of breath.   Cardiovascular:  Negative for chest pain, palpitations and leg swelling.  Gastrointestinal:  Negative for constipation, diarrhea, nausea and vomiting.  Endocrine: Negative for cold intolerance and heat intolerance.    {Labs  Heme  Chem  Endocrine  Serology  Results Review (optional):23779}   Objective    There were no vitals taken for this visit. BP Readings from Last 3 Encounters:  07/04/22 131/76  05/11/22 (!) 151/78  04/10/22 (!) 150/82   Wt Readings from Last 3 Encounters:  07/04/22 195 lb 3.2 oz (88.5 kg)  06/14/22 206 lb (93.4 kg)  05/11/22 206 lb 4.8 oz (93.6 kg)      Physical Exam  ***  No results found for any visits on 10/15/22.  Assessment & Plan     ***  No follow-ups on file.      {provider attestation***:1}   Lavon Paganini, MD  [facility] [phone] (phone) [phone] (fax)  [place]

## 2022-10-15 ENCOUNTER — Ambulatory Visit: Payer: Medicare Other | Admitting: Family Medicine

## 2022-10-15 DIAGNOSIS — E6609 Other obesity due to excess calories: Secondary | ICD-10-CM

## 2022-10-15 DIAGNOSIS — R7303 Prediabetes: Secondary | ICD-10-CM

## 2022-10-18 ENCOUNTER — Ambulatory Visit
Admission: RE | Admit: 2022-10-18 | Discharge: 2022-10-18 | Disposition: A | Discharge status: Home / Self Care | Payer: Medicare Other | Source: Ambulatory Visit | Attending: Family Medicine | Admitting: Family Medicine

## 2022-10-18 DIAGNOSIS — N63 Unspecified lump in unspecified breast: Secondary | ICD-10-CM

## 2022-10-18 DIAGNOSIS — N6310 Unspecified lump in the right breast, unspecified quadrant: Secondary | ICD-10-CM | POA: Diagnosis present

## 2022-10-18 DIAGNOSIS — N6312 Unspecified lump in the right breast, upper inner quadrant: Secondary | ICD-10-CM | POA: Insufficient documentation

## 2022-10-18 DIAGNOSIS — R92333 Mammographic heterogeneous density, bilateral breasts: Secondary | ICD-10-CM | POA: Diagnosis not present

## 2022-10-24 NOTE — Progress Notes (Unsigned)
   I, S ,acting as a scribe for Lavon Paganini, MD.,have documented all relevant documentation on the behalf of Lavon Paganini, MD,as directed by  Lavon Paganini, MD while in the presence of Lavon Paganini, MD.     Established patient visit   Patient: Sharon Parker   DOB: Feb 24, 1956   66 y.o. Female  MRN: [id number] Visit Date: 10/25/2022  Today's healthcare provider: Lavon Paganini, MD   No chief complaint on file.  Subjective    HPI  Follow up for obesity  The patient was last seen for this 3 months ago. Changes made at last visit include increase Ozempic to '1mg'$  dose.  She reports {excellent/good/fair/poor:19665} compliance with treatment. She feels that condition is {improved/worse/unchanged:3041574}. She {is/is not:21021397} having side effects. ***  Wt Readings from Last 3 Encounters:  07/04/22 195 lb 3.2 oz (88.5 kg)  06/14/22 206 lb (93.4 kg)  05/11/22 206 lb 4.8 oz (93.6 kg)    -----------------------------------------------------------------------------------------   Medications: Outpatient Medications Prior to Visit  Medication Sig   amLODipine (NORVASC) 5 MG tablet Take 1 tablet (5 mg total) by mouth daily.   Boswellia-Glucosamine-Vit D (OSTEO BI-FLEX ONE PER DAY) TABS Take by mouth.   fexofenadine (ALLEGRA) 180 MG tablet Take 180 mg by mouth daily.   fluticasone (FLONASE) 50 MCG/ACT nasal spray Place 2 sprays into both nostrils daily.   furosemide (LASIX) 20 MG tablet TAKE 1 TABLET BY MOUTH DAILY AS  NEEDED FOR EDEMA   Multiple Vitamin (MULTIVITAMINS PO) Take by mouth.   OZEMPIC, 1 MG/DOSE, 4 MG/3ML SOPN INJECT 1 MG UNDER THE SKIN ONE DAY A WEEK   rosuvastatin (CRESTOR) 5 MG tablet Take 1 tablet (5 mg total) by mouth daily.   UBIQUINOL PO Take by mouth.   No facility-administered medications prior to visit.    Review of Systems  Constitutional:  Negative for appetite change and fatigue.  Eyes:  Negative for visual  disturbance.  Respiratory:  Negative for chest tightness and shortness of breath.   Cardiovascular:  Negative for chest pain, palpitations and leg swelling.  Gastrointestinal:  Negative for constipation, diarrhea, nausea and vomiting.  Endocrine: Negative for cold intolerance and heat intolerance.    {Labs  Heme  Chem  Endocrine  Serology  Results Review (optional):23779}   Objective    There were no vitals taken for this visit. {Show previous vital signs (optional):23777}  Physical Exam  ***  No results found for any visits on 10/25/22.  Assessment & Plan     ***  No follow-ups on file.      {provider attestation***:1}   Lavon Paganini, MD  [facility] [phone] (phone) [phone] (fax)  [place]

## 2022-10-25 ENCOUNTER — Ambulatory Visit (INDEPENDENT_AMBULATORY_CARE_PROVIDER_SITE_OTHER): Payer: Medicare Other | Admitting: Family Medicine

## 2022-10-25 ENCOUNTER — Encounter: Payer: Self-pay | Admitting: Family Medicine

## 2022-10-25 VITALS — BP 136/74 | HR 69 | Temp 98.0°F | Resp 16 | Ht 66.0 in | Wt 192.7 lb

## 2022-10-25 DIAGNOSIS — E8881 Metabolic syndrome: Secondary | ICD-10-CM

## 2022-10-25 DIAGNOSIS — R7303 Prediabetes: Secondary | ICD-10-CM

## 2022-10-25 DIAGNOSIS — I10 Essential (primary) hypertension: Secondary | ICD-10-CM | POA: Diagnosis not present

## 2022-10-25 DIAGNOSIS — E6609 Other obesity due to excess calories: Secondary | ICD-10-CM | POA: Diagnosis not present

## 2022-10-25 DIAGNOSIS — Z6831 Body mass index (BMI) 31.0-31.9, adult: Secondary | ICD-10-CM

## 2022-10-25 DIAGNOSIS — H6991 Unspecified Eustachian tube disorder, right ear: Secondary | ICD-10-CM | POA: Diagnosis not present

## 2022-10-25 MED ORDER — OZEMPIC (1 MG/DOSE) 4 MG/3ML ~~LOC~~ SOPN: 1.0000 mg | PEN_INJECTOR | SUBCUTANEOUS | 1 refills | Status: DC

## 2022-10-25 NOTE — Assessment & Plan Note (Signed)
New problem Encouraged use of Flonase regularly

## 2022-10-25 NOTE — Assessment & Plan Note (Signed)
Discussed importance of healthy weight management Discussed diet and exercise Doing well and seeing good weight loss on Ozempic Will continue Ozempic 1 mg weekly Can consider higher dose in the future if needed

## 2022-10-25 NOTE — Assessment & Plan Note (Signed)
Chronic and well-controlled Continue current medications Reviewed recent metabolic panel

## 2022-10-25 NOTE — Assessment & Plan Note (Signed)
Continue Ozempic for prediabetes Seeing improvement in A1c

## 2022-10-25 NOTE — Assessment & Plan Note (Signed)
Continue Ozempic as above

## 2023-01-24 NOTE — Progress Notes (Signed)
I,Sulibeya S Dimas,acting as a scribe for Lavon Paganini, MD.,have documented all relevant documentation on the behalf of Lavon Paganini, MD,as directed by  Lavon Paganini, MD while in the presence of Lavon Paganini, MD.     Established patient visit   Patient: Sharon Parker   DOB: October 26, 1956   67 y.o. Female  MRN: [id number] Visit Date: 01/25/2023  Today's healthcare provider: Lavon Paganini, MD   Chief Complaint  Patient presents with   Hypertension   Prediabetes   Subjective    HPI  Hypertension, follow-up  BP Readings from Last 3 Encounters:  01/25/23 107/66  10/25/22 136/74  07/04/22 131/76   Wt Readings from Last 3 Encounters:  01/25/23 194 lb (88 kg)  10/25/22 192 lb 11.2 oz (87.4 kg)  07/04/22 195 lb 3.2 oz (88.5 kg)     She was last seen for hypertension 3 months ago.  BP at that visit was 136/74. Management since that visit includes none.Continue current medications.  She reports excellent compliance with treatment.  Outside blood pressures are stable.  Prediabetes/weight, Follow-up  Lab Results  Component Value Date   HGBA1C 5.2 01/25/2023   HGBA1C 5.6 07/04/2022   HGBA1C 5.7 (H) 01/30/2022   GLUCOSE 112 (H) 01/30/2022   GLUCOSE 105 (H) 09/29/2021   GLUCOSE 94 06/27/2021    Last seen for for this3 months ago.  Management since that visit includes continue Ozempic. Current symptoms include none and have been stable.  Pertinent Labs:    Component Value Date/Time   CHOL 173 07/04/2022 0934   TRIG 122 07/04/2022 0934   CHOLHDL 2.9 07/04/2022 0934   CREATININE 0.75 01/30/2022 0829   -----------------------------------------------------------------------------------------   Medications: Outpatient Medications Prior to Visit  Medication Sig   amLODipine (NORVASC) 5 MG tablet Take 1 tablet (5 mg total) by mouth daily.   Boswellia-Glucosamine-Vit D (OSTEO BI-FLEX ONE PER DAY) TABS Take by mouth.   fexofenadine (ALLEGRA) 180  MG tablet Take 180 mg by mouth daily.   fluticasone (FLONASE) 50 MCG/ACT nasal spray Place 2 sprays into both nostrils daily.   furosemide (LASIX) 20 MG tablet TAKE 1 TABLET BY MOUTH DAILY AS  NEEDED FOR EDEMA   Multiple Vitamin (MULTIVITAMINS PO) Take by mouth.   rosuvastatin (CRESTOR) 5 MG tablet Take 1 tablet (5 mg total) by mouth daily.   UBIQUINOL PO Take by mouth.   [DISCONTINUED] Semaglutide, 1 MG/DOSE, (OZEMPIC, 1 MG/DOSE,) 4 MG/3ML SOPN Inject 1 mg into the skin once a week.   No facility-administered medications prior to visit.    Review of Systems per HPI     Objective    BP 107/66 (BP Location: Left Arm, Patient Position: Sitting, Cuff Size: Large)   Pulse 72   Temp 98 F (36.7 C) (Temporal)   Resp 16   Wt 194 lb (88 kg)   SpO2 97%   BMI 31.31 kg/m  BP Readings from Last 3 Encounters:  01/25/23 107/66  10/25/22 136/74  07/04/22 131/76   Wt Readings from Last 3 Encounters:  01/25/23 194 lb (88 kg)  10/25/22 192 lb 11.2 oz (87.4 kg)  07/04/22 195 lb 3.2 oz (88.5 kg)      Physical Exam Vitals reviewed.  Constitutional:      General: She is not in acute distress.    Appearance: Normal appearance. She is well-developed. She is not diaphoretic.  HENT:     Head: Normocephalic and atraumatic.  Eyes:     General: No scleral icterus.  Conjunctiva/sclera: Conjunctivae normal.  Neck:     Thyroid: No thyromegaly.  Cardiovascular:     Rate and Rhythm: Normal rate and regular rhythm.     Pulses: Normal pulses.     Heart sounds: Normal heart sounds. No murmur heard. Pulmonary:     Effort: Pulmonary effort is normal. No respiratory distress.     Breath sounds: Normal breath sounds. No wheezing, rhonchi or rales.  Musculoskeletal:     Cervical back: Neck supple.     Right lower leg: No edema.     Left lower leg: No edema.  Lymphadenopathy:     Cervical: No cervical adenopathy.  Skin:    General: Skin is warm and dry.     Findings: No rash.  Neurological:      Mental Status: She is alert and oriented to person, place, and time. Mental status is at baseline.  Psychiatric:        Mood and Affect: Mood normal.        Behavior: Behavior normal.       Results for orders placed or performed in visit on 01/25/23  POCT HgB A1C  Result Value Ref Range   Hemoglobin A1C 5.2 4.0 - 5.6 %   Est. average glucose Bld gHb Est-mCnc 103     Assessment & Plan     Problem List Items Addressed This Visit       Cardiovascular and Mediastinum   Essential hypertension - Primary    Well controlled Continue current medications Recheck metabolic panel F/u in 6 months       Relevant Orders   Basic Metabolic Panel (BMET)     Endocrine   Subclinical hypothyroidism    Asymptomatic Recheck TSH and free T4      Relevant Orders   TSH + free T4     Other   Hypercholesteremia    Previously well controlled Continue statin Repeat FLP and CMP annually      History of smoking 25-50 pack years   Relevant Orders   Ambulatory Referral Lung Cancer Screening [facility]   Class 1 obesity due to excess calories with serious comorbidity and body mass index (BMI) of 31.0 to 31.9 in adult    Discussed importance of healthy weight management Discussed diet and exercise  Plataeued weight loss on Ozempic - increase to '2mg'$  dose      Relevant Medications   Semaglutide, 2 MG/DOSE, 8 MG/3ML SOPN   Prediabetes    Recommend low carb diet Recheck A1c - in normal range       Relevant Orders   POCT HgB A1C (Completed)   Metabolic syndrome    Continue ozempic as below        Return in about 6 months (around 07/28/2023) for CPE.      I, Lavon Paganini, MD, have reviewed all documentation for this visit. The documentation on 01/25/23 for the exam, diagnosis, procedures, and orders are all accurate and complete.   , Dionne Bucy, MD, MPH [facility]

## 2023-01-25 ENCOUNTER — Encounter: Payer: Self-pay | Admitting: Family Medicine

## 2023-01-25 ENCOUNTER — Telehealth: Payer: Self-pay | Admitting: Family Medicine

## 2023-01-25 ENCOUNTER — Ambulatory Visit (INDEPENDENT_AMBULATORY_CARE_PROVIDER_SITE_OTHER): Payer: Medicare Other | Admitting: Family Medicine

## 2023-01-25 VITALS — BP 107/66 | HR 72 | Temp 98.0°F | Resp 16 | Wt 194.0 lb

## 2023-01-25 DIAGNOSIS — Z87891 Personal history of nicotine dependence: Secondary | ICD-10-CM | POA: Diagnosis not present

## 2023-01-25 DIAGNOSIS — I10 Essential (primary) hypertension: Secondary | ICD-10-CM

## 2023-01-25 DIAGNOSIS — E8881 Metabolic syndrome: Secondary | ICD-10-CM

## 2023-01-25 DIAGNOSIS — E7800 Pure hypercholesterolemia, unspecified: Secondary | ICD-10-CM

## 2023-01-25 DIAGNOSIS — R7303 Prediabetes: Secondary | ICD-10-CM

## 2023-01-25 DIAGNOSIS — E038 Other specified hypothyroidism: Secondary | ICD-10-CM | POA: Diagnosis not present

## 2023-01-25 DIAGNOSIS — Z6831 Body mass index (BMI) 31.0-31.9, adult: Secondary | ICD-10-CM

## 2023-01-25 DIAGNOSIS — E6609 Other obesity due to excess calories: Secondary | ICD-10-CM

## 2023-01-25 LAB — POCT GLYCOSYLATED HEMOGLOBIN (HGB A1C)
Est. average glucose Bld gHb Est-mCnc: 103
Hemoglobin A1C: 5.2 % (ref 4.0–5.6)

## 2023-01-25 MED ORDER — SEMAGLUTIDE (2 MG/DOSE) 8 MG/3ML ~~LOC~~ SOPN: 2.0000 mg | PEN_INJECTOR | SUBCUTANEOUS | 1 refills | Status: DC

## 2023-01-25 NOTE — Assessment & Plan Note (Signed)
Well controlled Continue current medications Recheck metabolic panel F/u in 6 months  

## 2023-01-25 NOTE — Assessment & Plan Note (Addendum)
Discussed importance of healthy weight management Discussed diet and exercise  Plataeued weight loss on Ozempic - increase to '2mg'$  dose

## 2023-01-25 NOTE — Assessment & Plan Note (Signed)
Continue ozempic as below

## 2023-01-25 NOTE — Assessment & Plan Note (Signed)
Previously well controlled Continue statin Repeat FLP and CMP annually 

## 2023-01-25 NOTE — Assessment & Plan Note (Signed)
Asymptomatic ?Recheck TSH and free T4 ?

## 2023-01-25 NOTE — Assessment & Plan Note (Addendum)
Recommend low carb diet Recheck A1c - in normal range

## 2023-01-25 NOTE — Telephone Encounter (Signed)
[employer] requesting prior authorization Keyphase: [id number] Name: Sharon Parker Ozempic '2mg'$ /Dose '8mg'$ /55m pen injectors

## 2023-01-26 LAB — BASIC METABOLIC PANEL
BUN/Creatinine Ratio: 16 (ref 12–28)
BUN: 12 mg/dL (ref 8–27)
CO2: 23 mmol/L (ref 20–29)
Calcium: 9.1 mg/dL (ref 8.7–10.3)
Chloride: 103 mmol/L (ref 96–106)
Creatinine, Ser: 0.77 mg/dL (ref 0.57–1.00)
Glucose: 98 mg/dL (ref 70–99)
Potassium: 4.1 mmol/L (ref 3.5–5.2)
Sodium: 141 mmol/L (ref 134–144)
eGFR: 85 mL/min/{1.73_m2} (ref >59)

## 2023-01-26 LAB — TSH+FREE T4
Free T4: 1.33 ng/dL (ref 0.82–1.77)
TSH: 2.44 u[IU]/mL (ref 0.450–4.500)

## 2023-01-29 ENCOUNTER — Telehealth: Payer: Self-pay | Admitting: Family Medicine

## 2023-01-29 NOTE — Telephone Encounter (Signed)
[employer]mymeds prior authorization follow up  Keyphase: [id number] NameL Messmer Ozempic '2mg'$ .dose '8mg'$ /47m pen injectors

## 2023-01-29 NOTE — Telephone Encounter (Signed)
Office notes faxed to covermymeds [phone].

## 2023-01-29 NOTE — Telephone Encounter (Signed)
Duplicate

## 2023-01-29 NOTE — Telephone Encounter (Signed)
PA started today

## 2023-02-01 NOTE — Telephone Encounter (Signed)
Message from Plan Request Reference Number: [id number]. OZEMPIC INJ 8MG /3ML is denied due to Plan Exclusion.

## 2023-02-21 DIAGNOSIS — M1711 Unilateral primary osteoarthritis, right knee: Secondary | ICD-10-CM | POA: Diagnosis not present

## 2023-05-31 ENCOUNTER — Encounter: Payer: Self-pay | Admitting: Emergency Medicine

## 2023-06-26 ENCOUNTER — Encounter: Payer: Self-pay | Admitting: Podiatry

## 2023-06-26 ENCOUNTER — Ambulatory Visit (INDEPENDENT_AMBULATORY_CARE_PROVIDER_SITE_OTHER): Payer: Medicare Other

## 2023-06-26 ENCOUNTER — Ambulatory Visit: Payer: Medicare Other | Admitting: Podiatry

## 2023-06-26 VITALS — Ht 66.0 in | Wt 194.0 lb

## 2023-06-26 DIAGNOSIS — Z0000 Encounter for general adult medical examination without abnormal findings: Secondary | ICD-10-CM

## 2023-06-26 DIAGNOSIS — M7752 Other enthesopathy of left foot: Secondary | ICD-10-CM | POA: Diagnosis not present

## 2023-06-26 DIAGNOSIS — M778 Other enthesopathies, not elsewhere classified: Secondary | ICD-10-CM | POA: Diagnosis not present

## 2023-06-26 MED ORDER — MELOXICAM 15 MG PO TABS: 15.0000 mg | ORAL_TABLET | Freq: Every day | ORAL | 3 refills | Status: DC

## 2023-06-26 MED ORDER — METHYLPREDNISOLONE 4 MG PO TBPK: ORAL_TABLET | ORAL | 0 refills | Status: DC

## 2023-06-26 MED ORDER — TRIAMCINOLONE ACETONIDE 40 MG/ML IJ SUSP
20.0000 mg | Freq: Once | INTRAMUSCULAR | Status: AC
Start: 2023-06-26 — End: 2023-06-26
  Administered 2023-06-26: 20 mg

## 2023-06-26 NOTE — Progress Notes (Signed)
She presents today after having not seen her for a year with a chief concern of pain to the dorsal and dorsal lateral aspect of the left foot.  She states it is tender it has been cramping she can barely put her foot to the floor and cannot move her toes.  Objective: Vital signs are stable alert and oriented x 3.  Pulses are palpable.  She has swelling overlying the second metatarsophalangeal joint with severe pain on palpation to this area and on end range of motion.  She has medial deviation of the second toe.  Radiographs taken today do not demonstrate anything other than soft tissue swelling and some medial deviation of the toe at the level of the metatarsal phalangeal joint slightly elongated plantarflexed second metatarsal is present.  Assessment: Capsulitis predislocation syndrome second metatarsophalangeal joint left.  Plan: Placed her in a Darco shoe for stabilization of the joint and I injected around the joint today with 10 mg of Kenalog and 5 mg Marcaine.  She was also started on methylprednisolone to be followed by meloxicam.  I like to follow-up with her in 1 month.

## 2023-06-26 NOTE — Progress Notes (Signed)
Subjective:   Sharon Parker is a 67 y.o. female who presents for Medicare Annual (Subsequent) preventive examination.  Visit Complete: Virtual  I connected with  Sharon Parker on 06/26/23 by a audio enabled telemedicine application and verified that I am speaking with the correct person using two identifiers.  Patient Location: Home  Provider Location: Office/Clinic  I discussed the limitations of evaluation and management by telemedicine. The patient expressed understanding and agreed to proceed.  Vital Signs: Unable to obtain new vitals due to this being a telehealth visit.  Patient Medicare AWV questionnaire was completed by the patient on (06/25/23); I have confirmed that all information answered by patient is correct and no changes since this date.  Review of Systems    Cardiac Risk Factors include: advanced age (>86men, >63 women);dyslipidemia;hypertension;obesity (BMI >30kg/m2);sedentary lifestyle     Objective:    Today's Vitals   06/25/23 0828 06/26/23 1346  Weight:  194 lb (88 kg)  Height:  5\' 6"  (1.676 m)  PainSc: 6     Body mass index is 31.31 kg/m.     06/26/2023    1:52 PM 06/14/2022   11:24 AM 04/30/2018    8:00 AM 02/11/2017    9:18 AM  Advanced Directives  Does Patient Have a Medical Advance Directive? No No No No  Would patient like information on creating a medical advance directive?  No - Patient declined No - Patient declined     Current Medications (verified) Outpatient Encounter Medications as of 06/26/2023  Medication Sig   amLODipine (NORVASC) 5 MG tablet Take 1 tablet (5 mg total) by mouth daily.   Boswellia-Glucosamine-Vit D (OSTEO BI-FLEX ONE PER DAY) TABS Take by mouth.   fexofenadine (ALLEGRA) 180 MG tablet Take 180 mg by mouth daily.   fluticasone (FLONASE) 50 MCG/ACT nasal spray Place 2 sprays into both nostrils daily.   furosemide (LASIX) 20 MG tablet TAKE 1 TABLET BY MOUTH DAILY AS  NEEDED FOR EDEMA   meloxicam (MOBIC) 15 MG tablet  Take 1 tablet (15 mg total) by mouth daily.   methylPREDNISolone (MEDROL DOSEPAK) 4 MG TBPK tablet 6 day dose pack - take as directed   Multiple Vitamin (MULTIVITAMINS PO) Take by mouth.   UBIQUINOL PO Take by mouth.   [DISCONTINUED] rosuvastatin (CRESTOR) 5 MG tablet Take 1 tablet (5 mg total) by mouth daily.   [DISCONTINUED] Semaglutide, 2 MG/DOSE, 8 MG/3ML SOPN Inject 2 mg as directed once a week.   [EXPIRED] triamcinolone acetonide (KENALOG-40) injection 20 mg    No facility-administered encounter medications on file as of 06/26/2023.    Allergies (verified) Patient has no known allergies.   History: Past Medical History:  Diagnosis Date   Allergy    Arthritis 2017   Hyperlipidemia    Hypertension    Past Surgical History:  Procedure Laterality Date   CESAREAN SECTION  1994   COLONOSCOPY  2009   COLONOSCOPY WITH PROPOFOL N/A 04/30/2018   Procedure: COLONOSCOPY WITH PROPOFOL;  Surgeon: Earline Mayotte, MD;  Location: [facility] ENDOSCOPY;  Service: Endoscopy;  Laterality: N/A;   HERNIA REPAIR     NASAL SEPTUM SURGERY  1983   NECK SURGERY  2002   disc in neck replaced C5-C6   SPINE SURGERY  1999   C-5 C-7 disc neck   TONSILLECTOMY     TONSILLECTOMY AND ADENOIDECTOMY  1970   Family History  Problem Relation Age of Onset   Stroke Mother    Heart disease Mother  CHF   Lung cancer Father    Diabetes Sister    Obesity Sister    Heart attack Brother    Healthy Sister    Healthy Sister    Healthy Sister    Healthy Brother    Social History   Socioeconomic History   Marital status: Married    Spouse name: Not on file   Number of children: Not on file   Years of education: Not on file   Highest education level: Not on file  Occupational History   Not on file  Tobacco Use   Smoking status: Former    Current packs/day: 0.00    Average packs/day: 1 pack/day for 25.0 years (25.0 ttl pk-yrs)    Types: Cigarettes    Start date: 11/18/1986    Quit date:  11/19/2011    Years since quitting: 11.6   Smokeless tobacco: Never  Vaping Use   Vaping status: Never Used  Substance and Sexual Activity   Alcohol use: No   Drug use: No   Sexual activity: Not Currently    Birth control/protection: None  Other Topics Concern   Not on file  Social History Narrative   Not on file   Social Determinants of Health   Financial Resource Strain: Low Risk  (06/25/2023)   Overall Financial Resource Strain (CARDIA)    Difficulty of Paying Living Expenses: Not hard at all  Food Insecurity: No Food Insecurity (06/25/2023)   Hunger Vital Sign    Worried About Running Out of Food in the Last Year: Never true    Ran Out of Food in the Last Year: Never true  Transportation Needs: No Transportation Needs (06/25/2023)   PRAPARE - Administrator, Civil Service (Medical): No    Lack of Transportation (Non-Medical): No  Physical Activity: Patient Declined (06/25/2023)   Exercise Vital Sign    Days of Exercise per Week: Patient declined    Minutes of Exercise per Session: Patient declined  Stress: No Stress Concern Present (06/25/2023)   [employer] of Occupational Health - Occupational Stress Questionnaire    Feeling of Stress : Not at all  Social Connections: Unknown (06/25/2023)   Social Connection and Isolation Panel [NHANES]    Frequency of Communication with Friends and Family: More than three times a week    Frequency of Social Gatherings with Friends and Family: Not on file    Attends Religious Services: Not on file    Active Member of Clubs or Organizations: No    Attends Banker Meetings: Never    Marital Status: Married    Tobacco Counseling Counseling given: Not Answered   Clinical Intake:  Pre-visit preparation completed: Yes  Pain : 0-10 Pain Score: 6  Pain Type: Chronic pain (had injection in foot) Pain Location: Foot Pain Orientation: Left Pain Descriptors / Indicators: Aching Pain Onset: More than a month  ago Pain Frequency: Intermittent Pain Relieving Factors: medications, injections  Pain Relieving Factors: medications, injections  BMI - recorded: 31.31 Nutritional Status: BMI > 30  Obese Nutritional Risks: None Diabetes: No  How often do you need to have someone help you when you read instructions, pamphlets, or other written materials from your doctor or pharmacy?: 1 - Never  Interpreter Needed?: No  Comments: lives with husband, mother Information entered by :: B.,LPN   Activities of Daily Living    06/25/2023    8:28 AM 01/25/2023    8:12 AM  In your present state of health,  do you have any difficulty performing the following activities:  Hearing? 0 0  Vision? 0 0  Difficulty concentrating or making decisions? 0 0  Walking or climbing stairs? 0 0  Dressing or bathing? 0 0  Doing errands, shopping? 0 0  Preparing Food and eating ? N   Using the Toilet? N   In the past six months, have you accidently leaked urine? N   Do you have problems with loss of bowel control? N   Managing your Medications? N   Managing your Finances? N   Housekeeping or managing your Housekeeping? N     Patient Care Team: Erasmo Downer, MD as PCP - General (Family Medicine) Debbe Odea, MD as PCP - Cardiology (Cardiology)  Indicate any recent Medical Services you may have received from other than [facility] providers in the past year (date may be approximate).     Assessment:   This is a routine wellness examination for Sharon Parker.  Hearing/Vision screen Hearing Screening - Comments:: Adequate hearing Vision Screening - Comments:: Adequate vision w/glasses Dr Clydene Pugh  Dietary issues and exercise activities discussed:     Goals Addressed             This Visit's Progress    DIET - EAT MORE FRUITS AND VEGETABLES   On track      Depression Screen    06/26/2023    1:51 PM 01/25/2023    8:12 AM 10/25/2022    8:23 AM 06/14/2022   11:23 AM 04/10/2022    9:09 AM  01/30/2022    8:13 AM 09/29/2021    9:24 AM  PHQ 2/9 Scores  PHQ - 2 Score 0 0 0 0 0 0 0  PHQ- 9 Score  0 0 0 0 0 0    Fall Risk    06/25/2023    8:28 AM 01/25/2023    8:11 AM 10/25/2022    8:22 AM 06/14/2022   11:25 AM 04/10/2022    9:09 AM  Fall Risk   Falls in the past year? 0 0 0 0 0  Number falls in past yr:  0 0 0 0  Injury with Fall?  0 0 0 0  Risk for fall due to : No Fall Risks No Fall Risks No Fall Risks No Fall Risks   Follow up Education provided;Falls prevention discussed Falls evaluation completed Falls evaluation completed Falls evaluation completed     MEDICARE RISK AT HOME:   TIMED UP AND GO:  Was the test performed?  No    Cognitive Function:        06/26/2023    1:56 PM 06/14/2022   11:27 AM  6CIT Screen  What Year? 0 points 0 points  What month? 0 points 0 points  What time? 0 points 0 points  Count back from 20 0 points 0 points  Months in reverse 0 points 0 points  Repeat phrase 0 points 0 points  Total Score 0 points 0 points    Immunizations Immunization History  Administered Date(s) Administered   Fluad Quad(high Dose 65+) 08/22/2021, 08/13/2022   Influenza Split 08/24/2011, 09/20/2012   Influenza,inj,Quad PF,6+ Mos 09/19/2013, 09/02/2014, 09/10/2015, 08/25/2016, 08/24/2017, 08/12/2018, 08/15/2019   Influenza-Unspecified 08/24/2017, 08/12/2018, 08/17/2020   PFIZER Comirnaty(Gray Top)Covid-19 Tri-Sucrose Vaccine 01/14/2020, 02/04/2020, 10/17/2020, 08/13/2022   PNEUMOCOCCAL CONJUGATE-20 06/13/2021   Td 08/24/2011   Tdap 08/24/2011, 10/06/2021   Zoster Recombinant(Shingrix) 07/03/2021, 09/08/2021   Zoster, Live 08/24/2011    TDAP status: Up to date  Flu Vaccine  status: Up to date  Pneumococcal vaccine status: Up to date  Covid-19 vaccine status: Completed vaccines  Qualifies for Shingles Vaccine? Yes   Zostavax completed Yes   Shingrix Completed?: Yes  Screening Tests Health Maintenance  Topic Date Due   Lung Cancer Screening   Never done   COVID-19 Vaccine (5 - 2023-24 season) 10/08/2022   INFLUENZA VACCINE  06/20/2023   Medicare Annual Wellness (AWV)  06/25/2024   MAMMOGRAM  10/18/2024   Colonoscopy  04/30/2028   DTaP/Tdap/Td (4 - Td or Tdap) 10/07/2031   Pneumonia Vaccine 65+ Years old  Completed   DEXA SCAN  Completed   Hepatitis C Screening  Completed   Zoster Vaccines- Shingrix  Completed   HPV VACCINES  Aged Out    Health Maintenance  Health Maintenance Due  Topic Date Due   Lung Cancer Screening  Never done   COVID-19 Vaccine (5 - 2023-24 season) 10/08/2022   INFLUENZA VACCINE  06/20/2023    Colorectal cancer screening: Type of screening: Colonoscopy. Completed yes. Repeat every 10 years  Mammogram status: Completed yes. Repeat every year  Bone Density status: Completed yes. Results reflect: Bone density results: NORMAL. Repeat every 5 years.  Lung Cancer Screening: (Low Dose CT Chest recommended if Age 24-80 years, 20 pack-year currently smoking OR have quit w/in 15years.) does not qualify.   Lung Cancer Screening Referral: no  Additional Screening:  Hepatitis C Screening: does not qualify; Completed yes  Vision Screening: Recommended annual ophthalmology exams for early detection of glaucoma and other disorders of the eye. Is the patient up to date with their annual eye exam?  Yes  Who is the provider or what is the name of the office in which the patient attends annual eye exams? Dr Clydene Pugh If pt is not established with a provider, would they like to be referred to a provider to establish care? No .   Dental Screening: Recommended annual dental exams for proper oral hygiene  Diabetic Foot Exam: n/a  Community Resource Referral / Chronic Care Management: CRR required this visit?  No   CCM required this visit?  No    Plan:     I have personally reviewed and noted the following in the patient's chart:   Medical and social history Use of alcohol, tobacco or illicit drugs   Current medications and supplements including opioid prescriptions. Patient is not currently taking opioid prescriptions. Functional ability and status Nutritional status Physical activity Advanced directives List of other physicians Hospitalizations, surgeries, and ER visits in previous 12 months Vitals Screenings to include cognitive, depression, and falls Referrals and appointments  In addition, I have reviewed and discussed with patient certain preventive protocols, quality metrics, and best practice recommendations. A written personalized care plan for preventive services as well as general preventive health recommendations were provided to patient.     Sue Lush, LPN   11/24/1094   After Visit Summary: (MyChart) Due to this being a telephonic visit, the after visit summary with patients personalized plan was offered to patient via MyChart   Nurse Notes: The patient states she is doing well and has no concerns or questions at this time.

## 2023-06-26 NOTE — Patient Instructions (Signed)
Sharon Parker , Thank you for taking time to come for your Medicare Wellness Visit. I appreciate your ongoing commitment to your health goals. Please review the following plan we discussed and let me know if I can assist you in the future.   Referrals/Orders/Follow-Ups/Clinician Recommendations: none  This is a list of the screening recommended for you and due dates:  Health Maintenance  Topic Date Due   Screening for Lung Cancer  Never done   COVID-19 Vaccine (5 - 2023-24 season) 10/08/2022   Flu Shot  06/20/2023   Medicare Annual Wellness Visit  06/25/2024   Mammogram  10/18/2024   Colon Cancer Screening  04/30/2028   DTaP/Tdap/Td vaccine (4 - Td or Tdap) 10/07/2031   Pneumonia Vaccine  Completed   DEXA scan (bone density measurement)  Completed   Hepatitis C Screening  Completed   Zoster (Shingles) Vaccine  Completed   HPV Vaccine  Aged Out    Advanced directives: (Declined) Advance directive discussed with you today. Even though you declined this today, please call our office should you change your mind, and we can give you the proper paperwork for you to fill out.  Next Medicare Annual Wellness Visit scheduled for next year: Yes 06/30/24 @ 2pm telephone  Preventive Care 67 Years and Older, Female Preventive care refers to lifestyle choices and visits with your health care provider that can promote health and wellness. What does preventive care include? A yearly physical exam. This is also called an annual well check. Dental exams once or twice a year. Routine eye exams. Ask your health care provider how often you should have your eyes checked. Personal lifestyle choices, including: Daily care of your teeth and gums. Regular physical activity. Eating a healthy diet. Avoiding tobacco and drug use. Limiting alcohol use. Practicing safe sex. Taking low-dose aspirin every day. Taking vitamin and mineral supplements as recommended by your health care provider. What happens during  an annual well check? The services and screenings done by your health care provider during your annual well check will depend on your age, overall health, lifestyle risk factors, and family history of disease. Counseling  Your health care provider may ask you questions about your: Alcohol use. Tobacco use. Drug use. Emotional well-being. Home and relationship well-being. Sexual activity. Eating habits. History of falls. Memory and ability to understand (cognition). Work and work Astronomer. Reproductive health. Screening  You may have the following tests or measurements: Height, weight, and BMI. Blood pressure. Lipid and cholesterol levels. These may be checked every 5 years, or more frequently if you are over 52 years old. Skin check. Lung cancer screening. You may have this screening every year starting at age 55 if you have a 30-pack-year history of smoking and currently smoke or have quit within the past 15 years. Fecal occult blood test (FOBT) of the stool. You may have this test every year starting at age 23. Flexible sigmoidoscopy or colonoscopy. You may have a sigmoidoscopy every 5 years or a colonoscopy every 10 years starting at age 40. Hepatitis C blood test. Hepatitis B blood test. Sexually transmitted disease (STD) testing. Diabetes screening. This is done by checking your blood sugar (glucose) after you have not eaten for a while (fasting). You may have this done every 1-3 years. Bone density scan. This is done to screen for osteoporosis. You may have this done starting at age 40. Mammogram. This may be done every 1-2 years. Talk to your health care provider about how often you should  have regular mammograms. Talk with your health care provider about your test results, treatment options, and if necessary, the need for more tests. Vaccines  Your health care provider may recommend certain vaccines, such as: Influenza vaccine. This is recommended every year. Tetanus,  diphtheria, and acellular pertussis (Tdap, Td) vaccine. You may need a Td booster every 10 years. Zoster vaccine. You may need this after age 40. Pneumococcal 13-valent conjugate (PCV13) vaccine. One dose is recommended after age 47. Pneumococcal polysaccharide (PPSV23) vaccine. One dose is recommended after age 58. Talk to your health care provider about which screenings and vaccines you need and how often you need them. This information is not intended to replace advice given to you by your health care provider. Make sure you discuss any questions you have with your health care provider. Document Released: 12/02/2015 Document Revised: 07/25/2016 Document Reviewed: 09/06/2015 Elsevier Interactive Patient Education  2017 [employer].  Fall Prevention in the Home Falls can cause injuries. They can happen to people of all ages. There are many things you can do to make your home safe and to help prevent falls. What can I do on the outside of my home? Regularly fix the edges of walkways and driveways and fix any cracks. Remove anything that might make you trip as you walk through a door, such as a raised step or threshold. Trim any bushes or trees on the path to your home. Use bright outdoor lighting. Clear any walking paths of anything that might make someone trip, such as rocks or tools. Regularly check to see if handrails are loose or broken. Make sure that both sides of any steps have handrails. Any raised decks and porches should have guardrails on the edges. Have any leaves, snow, or ice cleared regularly. Use sand or salt on walking paths during winter. Clean up any spills in your garage right away. This includes oil or grease spills. What can I do in the bathroom? Use night lights. Install grab bars by the toilet and in the tub and shower. Do not use towel bars as grab bars. Use non-skid mats or decals in the tub or shower. If you need to sit down in the shower, use a plastic,  non-slip stool. Keep the floor dry. Clean up any water that spills on the floor as soon as it happens. Remove soap buildup in the tub or shower regularly. Attach bath mats securely with double-sided non-slip rug tape. Do not have throw rugs and other things on the floor that can make you trip. What can I do in the bedroom? Use night lights. Make sure that you have a light by your bed that is easy to reach. Do not use any sheets or blankets that are too big for your bed. They should not hang down onto the floor. Have a firm chair that has side arms. You can use this for support while you get dressed. Do not have throw rugs and other things on the floor that can make you trip. What can I do in the kitchen? Clean up any spills right away. Avoid walking on wet floors. Keep items that you use a lot in easy-to-reach places. If you need to reach something above you, use a strong step stool that has a grab bar. Keep electrical cords out of the way. Do not use floor polish or wax that makes floors slippery. If you must use wax, use non-skid floor wax. Do not have throw rugs and other things on the floor  that can make you trip. What can I do with my stairs? Do not leave any items on the stairs. Make sure that there are handrails on both sides of the stairs and use them. Fix handrails that are broken or loose. Make sure that handrails are as long as the stairways. Check any carpeting to make sure that it is firmly attached to the stairs. Fix any carpet that is loose or worn. Avoid having throw rugs at the top or bottom of the stairs. If you do have throw rugs, attach them to the floor with carpet tape. Make sure that you have a light switch at the top of the stairs and the bottom of the stairs. If you do not have them, ask someone to add them for you. What else can I do to help prevent falls? Wear shoes that: Do not have high heels. Have rubber bottoms. Are comfortable and fit you well. Are closed  at the toe. Do not wear sandals. If you use a stepladder: Make sure that it is fully opened. Do not climb a closed stepladder. Make sure that both sides of the stepladder are locked into place. Ask someone to hold it for you, if possible. Clearly mark and make sure that you can see: Any grab bars or handrails. First and last steps. Where the edge of each step is. Use tools that help you move around (mobility aids) if they are needed. These include: Canes. Walkers. Scooters. Crutches. Turn on the lights when you go into a dark area. Replace any light bulbs as soon as they burn out. Set up your furniture so you have a clear path. Avoid moving your furniture around. If any of your floors are uneven, fix them. If there are any pets around you, be aware of where they are. Review your medicines with your doctor. Some medicines can make you feel dizzy. This can increase your chance of falling. Ask your doctor what other things that you can do to help prevent falls. This information is not intended to replace advice given to you by your health care provider. Make sure you discuss any questions you have with your health care provider. Document Released: 09/01/2009 Document Revised: 04/12/2016 Document Reviewed: 12/10/2014 Elsevier Interactive Patient Education  2017 [employer].

## 2023-07-24 ENCOUNTER — Ambulatory Visit: Payer: Medicare Other | Admitting: Podiatry

## 2023-07-24 ENCOUNTER — Encounter: Payer: Self-pay | Admitting: Podiatry

## 2023-07-24 DIAGNOSIS — T148XXA Other injury of unspecified body region, initial encounter: Secondary | ICD-10-CM

## 2023-07-24 NOTE — Progress Notes (Signed)
She presents for follow-up of her capsulitis second metatarsal phalangeal joint left foot.  She states that it seems to be doing better but I think it has to do with the shoe.  Once I stepped out of the shoe is still painful.  Objective: Vital signs are stable she is alert and oriented x 3 pulses are palpable.  Still has pain on palpation and end range of motion of the second metatarsal phalangeal joint and pain on the lateral aspect of the metatarsal phalangeal joint.  There is medial deviation of the toe.  Assessment: Probable tear of the plantar plate conservative therapies have failed to render her asymptomatic.  Plan: Since conservative therapies have failed to render her asymptomatic at this point I highly recommended MRI of the forefoot left.  Expecting a tear of the collateral ligaments or the plantar plate allowing for the dorsiflexion and the medial shift of the toe.  She will continue anti-inflammatory medication she will continue the use of her Darco shoe until the MRI is complete.

## 2023-07-29 ENCOUNTER — Encounter: Payer: Self-pay | Admitting: Family Medicine

## 2023-07-29 ENCOUNTER — Ambulatory Visit (INDEPENDENT_AMBULATORY_CARE_PROVIDER_SITE_OTHER): Payer: Medicare Other | Admitting: Family Medicine

## 2023-07-29 VITALS — BP 139/67 | HR 71 | Ht 66.0 in | Wt 199.3 lb

## 2023-07-29 DIAGNOSIS — T466X5D Adverse effect of antihyperlipidemic and antiarteriosclerotic drugs, subsequent encounter: Secondary | ICD-10-CM | POA: Diagnosis not present

## 2023-07-29 DIAGNOSIS — Z1231 Encounter for screening mammogram for malignant neoplasm of breast: Secondary | ICD-10-CM

## 2023-07-29 DIAGNOSIS — Z0000 Encounter for general adult medical examination without abnormal findings: Secondary | ICD-10-CM | POA: Diagnosis not present

## 2023-07-29 DIAGNOSIS — M791 Myalgia, unspecified site: Secondary | ICD-10-CM | POA: Diagnosis not present

## 2023-07-29 DIAGNOSIS — I10 Essential (primary) hypertension: Secondary | ICD-10-CM

## 2023-07-29 DIAGNOSIS — E7800 Pure hypercholesterolemia, unspecified: Secondary | ICD-10-CM

## 2023-07-29 DIAGNOSIS — Z0001 Encounter for general adult medical examination with abnormal findings: Secondary | ICD-10-CM

## 2023-07-29 DIAGNOSIS — T466X5A Adverse effect of antihyperlipidemic and antiarteriosclerotic drugs, initial encounter: Secondary | ICD-10-CM

## 2023-07-29 DIAGNOSIS — R7303 Prediabetes: Secondary | ICD-10-CM

## 2023-07-29 DIAGNOSIS — E038 Other specified hypothyroidism: Secondary | ICD-10-CM | POA: Diagnosis not present

## 2023-07-29 NOTE — Assessment & Plan Note (Signed)
Recheck A1c  Was previously on Ozempic, insurance stopped covering so patient stopped taking med Continue to encourage lifestyle modifications

## 2023-07-29 NOTE — Assessment & Plan Note (Signed)
Asymptomatic ?Recheck TSH and free T4 ?

## 2023-07-29 NOTE — Assessment & Plan Note (Addendum)
Intolerable side effects due to simvastatin and rosuvastatin Resolved after stopping med

## 2023-07-29 NOTE — Progress Notes (Signed)
Complete physical exam  Patient: Sharon Parker   DOB: 1956/10/01   67 y.o. Female  MRN: [id number]  Subjective:    Chief Complaint  Patient presents with   Annual Exam    Diet-general well balanced  Exercise - walking 4 times a week for one hour Feeling and sleeping well Concern- none    Sharon Parker is a 67 y.o. female who presents today for a complete physical exam. She reports consuming a general diet. Home exercise routine includes walking 4 hrs per week. She generally feels well. She reports sleeping well. She does not have additional problems to discuss today.    Most recent fall risk assessment:    06/25/2023    8:28 AM  Fall Risk   Falls in the past year? 0  Risk for fall due to : No Fall Risks  Follow up Education provided;Falls prevention discussed     Most recent depression screenings:    06/26/2023    1:51 PM 01/25/2023    8:12 AM  PHQ 2/9 Scores  PHQ - 2 Score 0 0  PHQ- 9 Score  0   Patient Active Problem List   Diagnosis Date Noted   Metabolic syndrome 05/11/2022   Prediabetes 01/30/2022   Myalgia due to statin 06/27/2021   Class 1 obesity due to excess calories with serious comorbidity and body mass index (BMI) of 31.0 to 31.9 in adult 06/10/2020   Essential hypertension 06/10/2020   Adrenal adenoma 01/20/2016   Allergic rhinitis 01/20/2016   Arthritis, degenerative 01/20/2016   Subclinical hypothyroidism 01/20/2016   History of smoking 25-50 pack years 01/20/2016   Hypercholesteremia 05/17/2015     Patient Care Team: Erasmo Downer, MD as PCP - General (Family Medicine) Debbe Odea, MD as PCP - Cardiology (Cardiology)   Outpatient Medications Prior to Visit  Medication Sig   amLODipine (NORVASC) 5 MG tablet Take 1 tablet (5 mg total) by mouth daily.   Boswellia-Glucosamine-Vit D (OSTEO BI-FLEX ONE PER DAY) TABS Take by mouth.   fexofenadine (ALLEGRA) 180 MG tablet Take 180 mg by mouth daily.   fluticasone (FLONASE) 50  MCG/ACT nasal spray Place 2 sprays into both nostrils daily.   furosemide (LASIX) 20 MG tablet TAKE 1 TABLET BY MOUTH DAILY AS  NEEDED FOR EDEMA   meloxicam (MOBIC) 15 MG tablet Take 1 tablet (15 mg total) by mouth daily.   Multiple Vitamin (MULTIVITAMINS PO) Take by mouth.   UBIQUINOL PO Take by mouth.   No facility-administered medications prior to visit.    Review of Systems  All other systems reviewed and are negative.      Objective:     BP 139/67 (BP Location: Left Arm, Patient Position: Sitting, Cuff Size: Normal)   Pulse 71   Ht 5\' 6"  (1.676 m)   Wt 199 lb 4.8 oz (90.4 kg)   SpO2 98%   BMI 32.17 kg/m  BP Readings from Last 3 Encounters:  07/29/23 139/67  01/25/23 107/66  10/25/22 136/74   Wt Readings from Last 3 Encounters:  07/29/23 199 lb 4.8 oz (90.4 kg)  06/26/23 194 lb (88 kg)  01/25/23 194 lb (88 kg)      Physical Exam Constitutional:      Appearance: Normal appearance.  HENT:     Head: Normocephalic and atraumatic.     Right Ear: Tympanic membrane, ear canal and external ear normal.     Left Ear: Tympanic membrane, ear canal and external ear normal.  Mouth/Throat:     Mouth: Mucous membranes are moist.     Pharynx: Oropharynx is clear.  Eyes:     Conjunctiva/sclera: Conjunctivae normal.     Pupils: Pupils are equal, round, and reactive to light.  Cardiovascular:     Rate and Rhythm: Normal rate and regular rhythm.     Heart sounds: Normal heart sounds.  Pulmonary:     Effort: Pulmonary effort is normal.     Breath sounds: Normal breath sounds.  Neurological:     General: No focal deficit present.     Mental Status: She is alert and oriented to person, place, and time.      No results found for any visits on 07/29/23. Last CBC Lab Results  Component Value Date   WBC 6.2 07/04/2022   HGB 13.9 07/04/2022   HCT 40.9 07/04/2022   MCV 92 07/04/2022   MCH 31.1 07/04/2022   RDW 13.2 07/04/2022   PLT 250 07/04/2022   Last metabolic  panel Lab Results  Component Value Date   GLUCOSE 98 01/25/2023   NA 141 01/25/2023   K 4.1 01/25/2023   CL 103 01/25/2023   CO2 23 01/25/2023   BUN 12 01/25/2023   CREATININE 0.77 01/25/2023   EGFR 85 01/25/2023   CALCIUM 9.1 01/25/2023   PROT 6.8 01/30/2022   ALBUMIN 4.5 01/30/2022   LABGLOB 2.3 01/30/2022   AGRATIO 2.0 01/30/2022   BILITOT 0.4 01/30/2022   ALKPHOS 97 01/30/2022   AST 15 01/30/2022   ALT 15 01/30/2022   Last lipids Lab Results  Component Value Date   CHOL 173 07/04/2022   HDL 59 07/04/2022   LDLCALC 92 07/04/2022   TRIG 122 07/04/2022   CHOLHDL 2.9 07/04/2022   Last hemoglobin A1c Lab Results  Component Value Date   HGBA1C 5.2 01/25/2023        Assessment & Plan:    Routine Health Maintenance and Physical Exam  Immunization History  Administered Date(s) Administered   Fluad Quad(high Dose 65+) 08/22/2021, 08/13/2022   Influenza Split 08/24/2011, 09/20/2012   Influenza,inj,Quad PF,6+ Mos 09/19/2013, 09/02/2014, 09/10/2015, 08/25/2016, 08/24/2017, 08/12/2018, 08/15/2019   Influenza-Unspecified 08/24/2017, 08/12/2018, 08/17/2020   PFIZER Comirnaty(Gray Top)Covid-19 Tri-Sucrose Vaccine 01/14/2020, 02/04/2020, 10/17/2020, 08/13/2022   PNEUMOCOCCAL CONJUGATE-20 06/13/2021   Td 08/24/2011   Tdap 08/24/2011, 10/06/2021   Zoster Recombinant(Shingrix) 07/03/2021, 09/08/2021   Zoster, Live 08/24/2011    Health Maintenance  Topic Date Due   Lung Cancer Screening  Never done   INFLUENZA VACCINE  06/20/2023   COVID-19 Vaccine (5 - 2023-24 season) 07/21/2023   Medicare Annual Wellness (AWV)  06/25/2024   MAMMOGRAM  10/18/2024   Colonoscopy  04/30/2028   DTaP/Tdap/Td (4 - Td or Tdap) 10/07/2031   Pneumonia Vaccine 79+ Years old  Completed   DEXA SCAN  Completed   Hepatitis C Screening  Completed   Zoster Vaccines- Shingrix  Completed   HPV VACCINES  Aged Out    Discussed health benefits of physical activity, and encouraged her to engage in  regular exercise appropriate for her age and condition.  Problem List Items Addressed This Visit       Cardiovascular and Mediastinum   Essential hypertension    Well controlled  In office BP was at goal Recheck CMP Fu in 6 months       Relevant Orders   Comprehensive metabolic panel     Endocrine   Subclinical hypothyroidism    Asymptomatic  Recheck TSH and free T4  Relevant Orders   TSH + free T4     Other   Hypercholesteremia    Well controlled  Last lipid panel was wnl  Recheck lipid panel  Documented statin intolerance Encourage lifestyle modifications Consider starting Zetia depending on labs       Relevant Orders   Lipid Panel With LDL/HDL Ratio   Myalgia due to statin    Intolerable side effects due to simvastatin and rosuvastatin Resolved after stopping med      Prediabetes    Recheck A1c  Was previously on Ozempic, insurance stopped covering so patient stopped taking med Continue to encourage lifestyle modifications      Relevant Orders   Comprehensive metabolic panel   Hemoglobin A1c   Other Visit Diagnoses     Encounter for annual physical exam    -  Primary   Relevant Orders   Comprehensive metabolic panel   Lipid Panel With LDL/HDL Ratio   Hemoglobin A1c   TSH + free T4   Breast cancer screening by mammogram       Relevant Orders   MM 3D SCREENING MAMMOGRAM BILATERAL BREAST      Return in about 6 months (around 01/26/2024) for chronic disease f/u.     Rometta Emery, Medical Student   Patient seen along with MS3 student Jodi Marble. I personally evaluated this patient along with the student, and verified all aspects of the history, physical exam, and medical decision making as documented by the student. I agree with the student's documentation and have made all necessary edits.  [place] Dal[place]o, Marzella Schlein, MD, MPH [facility]

## 2023-07-29 NOTE — Assessment & Plan Note (Signed)
Chronic and well controlled  BP in office was at goal  Continue current meds  Recheck CMP Fu in 6 months

## 2023-07-29 NOTE — Assessment & Plan Note (Addendum)
Well controlled  Last lipid panel was wnl  Recheck lipid panel  Documented statin intolerance Encourage lifestyle modifications Consider starting Zetia depending on labs

## 2023-07-29 NOTE — Assessment & Plan Note (Signed)
Well controlled  In office BP was at goal Recheck CMP Fu in 6 months

## 2023-07-30 ENCOUNTER — Ambulatory Visit
Admission: RE | Admit: 2023-07-30 | Discharge: 2023-07-30 | Disposition: A | Discharge status: Home / Self Care | Payer: Medicare Other | Source: Ambulatory Visit | Attending: Podiatry | Admitting: Podiatry

## 2023-07-30 ENCOUNTER — Other Ambulatory Visit: Payer: Self-pay

## 2023-07-30 DIAGNOSIS — T148XXA Other injury of unspecified body region, initial encounter: Secondary | ICD-10-CM

## 2023-07-30 DIAGNOSIS — M6289 Other specified disorders of muscle: Secondary | ICD-10-CM | POA: Diagnosis not present

## 2023-07-30 DIAGNOSIS — M79672 Pain in left foot: Secondary | ICD-10-CM | POA: Diagnosis not present

## 2023-07-30 LAB — COMPREHENSIVE METABOLIC PANEL
ALT: 17 IU/L (ref 0–32)
AST: 18 IU/L (ref 0–40)
Albumin: 4.5 g/dL (ref 3.9–4.9)
Alkaline Phosphatase: 124 IU/L — ABNORMAL HIGH (ref 44–121)
BUN/Creatinine Ratio: 19 (ref 12–28)
BUN: 14 mg/dL (ref 8–27)
Bilirubin Total: 0.4 mg/dL (ref 0.0–1.2)
CO2: 23 mmol/L (ref 20–29)
Calcium: 9.1 mg/dL (ref 8.7–10.3)
Chloride: 101 mmol/L (ref 96–106)
Creatinine, Ser: 0.74 mg/dL (ref 0.57–1.00)
Globulin, Total: 2.3 g/dL (ref 1.5–4.5)
Glucose: 113 mg/dL — ABNORMAL HIGH (ref 70–99)
Potassium: 4.2 mmol/L (ref 3.5–5.2)
Sodium: 140 mmol/L (ref 134–144)
Total Protein: 6.8 g/dL (ref 6.0–8.5)
eGFR: 89 mL/min/{1.73_m2} (ref >59)

## 2023-07-30 LAB — LIPID PANEL WITH LDL/HDL RATIO
Cholesterol, Total: 278 mg/dL — ABNORMAL HIGH (ref 100–199)
HDL: 56 mg/dL (ref >39)
LDL Chol Calc (NIH): 160 mg/dL — ABNORMAL HIGH (ref 0–99)
LDL/HDL Ratio: 2.9 ratio (ref 0.0–3.2)
Triglycerides: 328 mg/dL — ABNORMAL HIGH (ref 0–149)
VLDL Cholesterol Cal: 62 mg/dL — ABNORMAL HIGH (ref 5–40)

## 2023-07-30 LAB — TSH+FREE T4
Free T4: 1.34 ng/dL (ref 0.82–1.77)
TSH: 2.64 u[IU]/mL (ref 0.450–4.500)

## 2023-07-30 LAB — HEMOGLOBIN A1C
Est. average glucose Bld gHb Est-mCnc: 120 mg/dL
Hgb A1c MFr Bld: 5.8 % — ABNORMAL HIGH (ref 4.8–5.6)

## 2023-07-30 MED ORDER — EZETIMIBE 10 MG PO TABS: 10.0000 mg | ORAL_TABLET | Freq: Every day | ORAL | 1 refills | Status: DC

## 2023-08-13 ENCOUNTER — Other Ambulatory Visit: Payer: Self-pay | Admitting: Family Medicine

## 2023-08-13 DIAGNOSIS — I10 Essential (primary) hypertension: Secondary | ICD-10-CM

## 2023-08-13 NOTE — Telephone Encounter (Signed)
Requested Prescriptions  Pending Prescriptions Disp Refills   amLODipine (NORVASC) 5 MG tablet [Pharmacy Med Name: amLODIPine Besylate 5 MG Oral Tablet] 100 tablet 1    Sig: TAKE 1 TABLET BY MOUTH DAILY     Cardiovascular: Calcium Channel Blockers 2 Passed - 08/13/2023  5:16 AM      Passed - Last BP in normal range    BP Readings from Last 1 Encounters:  07/29/23 139/67         Passed - Last Heart Rate in normal range    Pulse Readings from Last 1 Encounters:  07/29/23 71         Passed - Valid encounter within last 6 months    Recent Outpatient Visits           2 weeks ago Encounter for annual physical exam   [facility] [place], Marzella Schlein, MD   6 months ago Essential hypertension   [facility] [place], Marzella Schlein, MD   9 months ago Class 1 obesity due to excess calories with serious comorbidity and body mass index (BMI) of 31.0 to 31.9 in adult   [facility], Marzella Schlein, MD   1 year ago Annual physical exam   [facility] Caro Laroche, DO   1 year ago Class 1 obesity due to excess calories with serious comorbidity and body mass index (BMI) of 33.0 to 33.9 in adult   [facility] Bacigal[facility], Marzella Schlein, MD       Future Appointments             In 5 months Bacigalupo, Marzella Schlein, MD [facility], [facility]

## 2023-08-28 ENCOUNTER — Encounter: Payer: Self-pay | Admitting: Podiatry

## 2023-08-28 NOTE — Progress Notes (Signed)
[facility] Quality Team Note  Name: Sharon Parker Date of Birth: 1956-03-19 MRN: [id number] Date: 08/28/2023  [facility] Quality Team has reviewed this patient's chart, please see recommendations below:  [facility] Quality Other; (PATIENT DUE FOR KED GAP- NEEDS URINE MICROALBUMIN/CREATININE RATIO TEST COMPLETED FOR GAP CLOSURE.)

## 2023-09-04 ENCOUNTER — Other Ambulatory Visit: Payer: Self-pay

## 2023-09-04 DIAGNOSIS — I10 Essential (primary) hypertension: Secondary | ICD-10-CM

## 2023-09-04 DIAGNOSIS — R7303 Prediabetes: Secondary | ICD-10-CM

## 2023-09-23 ENCOUNTER — Ambulatory Visit: Payer: Medicare Other | Admitting: Podiatry

## 2023-09-23 ENCOUNTER — Encounter: Payer: Self-pay | Admitting: Podiatry

## 2023-09-23 DIAGNOSIS — M778 Other enthesopathies, not elsewhere classified: Secondary | ICD-10-CM

## 2023-09-23 NOTE — Progress Notes (Signed)
She presents today for follow-up of her MRI.  She states that she is improving slightly.  Objective: Vital sign table oriented x 3 pain is no longer reproducible.  MRI was essentially negative.  Pulses remain strong and palpable.  Assessment: Capsulitis.  Plan: Follow-up with [place] as needed.

## 2023-10-21 ENCOUNTER — Ambulatory Visit
Admission: RE | Admit: 2023-10-21 | Discharge: 2023-10-21 | Disposition: A | Discharge status: Home / Self Care | Payer: Medicare Other | Source: Ambulatory Visit | Attending: Family Medicine | Admitting: Family Medicine

## 2023-10-21 DIAGNOSIS — Z1231 Encounter for screening mammogram for malignant neoplasm of breast: Secondary | ICD-10-CM | POA: Insufficient documentation

## 2023-10-23 ENCOUNTER — Other Ambulatory Visit: Payer: Self-pay | Admitting: Family Medicine

## 2023-10-23 DIAGNOSIS — R928 Other abnormal and inconclusive findings on diagnostic imaging of breast: Secondary | ICD-10-CM

## 2023-10-29 ENCOUNTER — Ambulatory Visit
Admission: RE | Admit: 2023-10-29 | Discharge: 2023-10-29 | Disposition: A | Discharge status: Home / Self Care | Payer: Medicare Other | Source: Ambulatory Visit | Attending: Family Medicine | Admitting: Family Medicine

## 2023-10-29 DIAGNOSIS — R92321 Mammographic fibroglandular density, right breast: Secondary | ICD-10-CM | POA: Diagnosis not present

## 2023-10-29 DIAGNOSIS — R928 Other abnormal and inconclusive findings on diagnostic imaging of breast: Secondary | ICD-10-CM

## 2023-12-20 ENCOUNTER — Other Ambulatory Visit: Payer: Self-pay | Admitting: Family Medicine

## 2023-12-20 MED ORDER — EZETIMIBE 10 MG PO TABS: 10.0000 mg | ORAL_TABLET | Freq: Every day | ORAL | 0 refills | Status: DC

## 2023-12-20 NOTE — Telephone Encounter (Signed)
Medication Refill -  Most Recent Primary Care Visit:  Provider: Erasmo Downer  Department: [facility]  Visit Type: PHYSICAL  Date: 07/29/2023  Medication: ezetimibe (ZETIA) 10 MG tablet   Has the patient contacted their pharmacy? Yes   Is this the correct pharmacy for this prescription? Yes   This is the patient's preferred pharmacy:   [id number] - [place], [place] - [address] AT [facility] OF [address] & [address] [place] [address] [place] [address] Phone: [phone] Fax: [phone]   Has the prescription been filled recently? Yes  Is the patient out of the medication? Yes  Has the patient been seen for an appointment in the last year OR does the patient have an upcoming appointment? Yes  Can we respond through MyChart? Yes  Agent: Please be advised that Rx refills may take up to 3 business days. We ask that you follow-up with your pharmacy.

## 2023-12-20 NOTE — Telephone Encounter (Signed)
Requested Prescriptions  Pending Prescriptions Disp Refills   ezetimibe (ZETIA) 10 MG tablet 90 tablet 0    Sig: Take 1 tablet (10 mg total) by mouth daily.     Cardiovascular:  Antilipid - Sterol Transport Inhibitors Failed - 12/20/2023  3:55 PM      Failed - Lipid Panel in normal range within the last 12 months    Cholesterol, Total  Date Value Ref Range Status  07/29/2023 278 (H) 100 - 199 mg/dL Final   LDL Chol Calc (NIH)  Date Value Ref Range Status  07/29/2023 160 (H) 0 - 99 mg/dL Final   HDL  Date Value Ref Range Status  07/29/2023 56 >39 mg/dL Final   Triglycerides  Date Value Ref Range Status  07/29/2023 328 (H) 0 - 149 mg/dL Final         Passed - AST in normal range and within 360 days    AST  Date Value Ref Range Status  07/29/2023 18 0 - 40 IU/L Final         Passed - ALT in normal range and within 360 days    ALT  Date Value Ref Range Status  07/29/2023 17 0 - 32 IU/L Final         Passed - Patient is not pregnant      Passed - Valid encounter within last 12 months    Recent Outpatient Visits           4 months ago Encounter for annual physical exam   [facility] [place], Marzella Schlein, MD   10 months ago Essential hypertension   [facility], Marzella Schlein, MD   1 year ago Class 1 obesity due to excess calories with serious comorbidity and body mass index (BMI) of 31.0 to 31.9 in adult   [facility], Marzella Schlein, MD   1 year ago Annual physical exam   [facility] Caro Laroche, DO   1 year ago Class 1 obesity due to excess calories with serious comorbidity and body mass index (BMI) of 33.0 to 33.9 in adult   [facility] Bacigalupo, Marzella Schlein, MD       Future Appointments             In 1 month Bacigalupo, Marzella Schlein, MD [facility], [facility]

## 2024-01-27 ENCOUNTER — Other Ambulatory Visit: Payer: Self-pay

## 2024-01-27 ENCOUNTER — Encounter: Payer: Self-pay | Admitting: Family Medicine

## 2024-01-27 ENCOUNTER — Telehealth: Payer: Self-pay | Admitting: Acute Care

## 2024-01-27 ENCOUNTER — Ambulatory Visit (INDEPENDENT_AMBULATORY_CARE_PROVIDER_SITE_OTHER): Payer: Medicare Other | Admitting: Family Medicine

## 2024-01-27 VITALS — BP 131/77 | HR 68 | Ht 65.5 in | Wt 196.4 lb

## 2024-01-27 DIAGNOSIS — Z6832 Body mass index (BMI) 32.0-32.9, adult: Secondary | ICD-10-CM

## 2024-01-27 DIAGNOSIS — E038 Other specified hypothyroidism: Secondary | ICD-10-CM | POA: Diagnosis not present

## 2024-01-27 DIAGNOSIS — Z87891 Personal history of nicotine dependence: Secondary | ICD-10-CM

## 2024-01-27 DIAGNOSIS — I10 Essential (primary) hypertension: Secondary | ICD-10-CM

## 2024-01-27 DIAGNOSIS — R7303 Prediabetes: Secondary | ICD-10-CM | POA: Diagnosis not present

## 2024-01-27 DIAGNOSIS — E7800 Pure hypercholesterolemia, unspecified: Secondary | ICD-10-CM | POA: Diagnosis not present

## 2024-01-27 DIAGNOSIS — E6609 Other obesity due to excess calories: Secondary | ICD-10-CM

## 2024-01-27 DIAGNOSIS — E66811 Obesity, class 1: Secondary | ICD-10-CM

## 2024-01-27 DIAGNOSIS — Z122 Encounter for screening for malignant neoplasm of respiratory organs: Secondary | ICD-10-CM

## 2024-01-27 NOTE — Telephone Encounter (Signed)
 Lung Cancer Screening Narrative/Criteria Questionnaire (Cigarette Smokers Only- No Cigars/Pipes/vapes)   Sharon Parker   SDMV:02/12/24 at 1230p / K[facility]ST[facility]                                           1956-09-04              LDCT: 02/13/24 at 10am / [facility] [place]    68 y.o.   Phone: [phone]  Lung Screening Narrative (confirm age 22-77 yrs Medicare / 50-80 yrs Private pay insurance)   Insurance information:[employer]   Referring Provider:Bacigalupo   This screening involves an initial phone call with a team member from our program. It is called a shared decision making visit. The initial meeting is required by insurance and Medicare to make sure you understand the program. This appointment takes about 15-20 minutes to complete. The CT scan will completed at a separate date/time. This scan takes about 5-10 minutes to complete and you may eat and drink before and after the scan.  Criteria questions for Lung Cancer Screening:   Are you a current or former smoker? Former Age began smoking: 68 yo   If you are a former smoker, what year did you quit smoking?2016 /additional 2 years   To calculate your smoking history, I need an accurate estimate of how many packs of cigarettes you smoked per day and for how many years. (Not just the number of PPD you are now smoking)   Years smoking 42 x Packs per day 1 = Pack years 42   (at least 20 pack yrs)   (Make sure they understand that we need to know how much they have smoked in the past, not just the number of PPD they are smoking now)  Do you have a personal history of cancer?  No    Do you have a family history of cancer? Yes  (cancer type and and relative) sister & father/lung  Are you coughing up blood?  No  Have you had unexplained weight loss of 15 lbs or more in the last 6 months? No  It looks like you meet all criteria.     Additional information: N/A

## 2024-01-27 NOTE — Assessment & Plan Note (Signed)
 Subclinical hypothyroidism with variable thyroid function. Plans to recheck thyroid function tests. - Order thyroid function tests

## 2024-01-27 NOTE — Assessment & Plan Note (Signed)
 Hyperlipidemia managed with Zetia (ezetimibe) 10 mg daily. Previous cholesterol levels increased in September. Plans to recheck cholesterol levels and anticipates improvement with continued medication adherence. - Order lipid panel - Continue Zetia 10 mg daily

## 2024-01-27 NOTE — Assessment & Plan Note (Signed)
 Hypertension managed with amlodipine 5 mg daily. Blood pressure readings are well-controlled. - Continue amlodipine 5 mg daily - Monitor blood pressure

## 2024-01-27 NOTE — Progress Notes (Signed)
 Established patient visit   Patient: Sharon Parker   DOB: [birth year]   68 y.o. Female  MRN: [id number] Visit Date: 01/27/2024  Today's healthcare provider: Shirlee Latch, MD   Chief Complaint  Patient presents with   Medical Management of Chronic Issues    6 month follow-up. Patient reports taking all medications as prescribed with no symptoms to report   Hyperlipidemia   Hypertension    Patient monitors at home if she is having symptoms or feeling uneasy   Pre-Diabetes    Patient does not monitor at home uACR collected    Subjective    Hyperlipidemia  Hypertension   HPI     Medical Management of Chronic Issues    Additional comments: 6 month follow-up. Patient reports taking all medications as prescribed with no symptoms to report        Hypertension    Additional comments: Patient monitors at home if she is having symptoms or feeling uneasy        Pre-Diabetes    Additional comments: Patient does not monitor at home uACR collected       Last edited by Acey Lav, CMA on 01/27/2024  8:39 AM.       Discussed the use of AI scribe software for clinical note transcription with the patient, who gave verbal consent to proceed.  History of Present Illness   A 68 year old patient with a history of prediabetes, subclinical hypothyroidism, hyperlipidemia, and hypertension presents for a routine follow-up. The patient has been on amlodipine 5mg  daily, Zetia 10mg  daily, and Lasix 20mg  daily as needed. Recently, the patient started on Zepbound for weight loss and has been tolerating it well with no reported issues. The patient has noticed significant weight loss since starting Zepbound, losing about 16 pounds over the past six weeks. The patient also reports good control of blood pressure. The patient has a history of smoking but quit about 10-11 years ago. The patient denies any current concerns or issues.         Medications: Outpatient Medications  Prior to Visit  Medication Sig   amLODipine (NORVASC) 5 MG tablet TAKE 1 TABLET BY MOUTH DAILY   ezetimibe (ZETIA) 10 MG tablet Take 1 tablet (10 mg total) by mouth daily.   fexofenadine (ALLEGRA) 180 MG tablet Take 180 mg by mouth daily.   fluticasone (FLONASE) 50 MCG/ACT nasal spray Place 2 sprays into both nostrils daily.   meloxicam (MOBIC) 15 MG tablet Take 1 tablet (15 mg total) by mouth daily.   Multiple Vitamin (MULTIVITAMINS PO) Take by mouth.   UBIQUINOL PO Take by mouth.   Boswellia-Glucosamine-Vit D (OSTEO BI-FLEX ONE PER DAY) TABS Take by mouth. (Patient not taking: Reported on 01/27/2024)   furosemide (LASIX) 20 MG tablet TAKE 1 TABLET BY MOUTH DAILY AS  NEEDED FOR EDEMA (Patient not taking: Reported on 01/27/2024)   No facility-administered medications prior to visit.    Review of Systems     Objective    BP 131/77 (BP Location: Right Arm, Patient Position: Sitting, Cuff Size: Large)   Pulse 68   Ht 5' 5.5" (1.664 m)   Wt 196 lb 6.4 oz (89.1 kg)   BMI 32.19 kg/m    Physical Exam Vitals reviewed.  Constitutional:      General: She is not in acute distress.    Appearance: Normal appearance. She is well-developed. She is not diaphoretic.  HENT:     Head: Normocephalic and atraumatic.  Eyes:  General: No scleral icterus.    Conjunctiva/sclera: Conjunctivae normal.  Neck:     Thyroid: No thyromegaly.  Cardiovascular:     Rate and Rhythm: Normal rate and regular rhythm.     Heart sounds: Normal heart sounds. No murmur heard. Pulmonary:     Effort: Pulmonary effort is normal. No respiratory distress.     Breath sounds: Normal breath sounds. No wheezing, rhonchi or rales.  Musculoskeletal:     Cervical back: Neck supple.     Right lower leg: No edema.     Left lower leg: No edema.  Lymphadenopathy:     Cervical: No cervical adenopathy.  Skin:    General: Skin is warm and dry.     Findings: No rash.  Neurological:     Mental Status: She is alert and  oriented to person, place, and time. Mental status is at baseline.  Psychiatric:        Mood and Affect: Mood normal.        Behavior: Behavior normal.      No results found for any visits on 01/27/24.  Assessment & Plan     Problem List Items Addressed This Visit       Cardiovascular and Mediastinum   Essential hypertension   Hypertension managed with amlodipine 5 mg daily. Blood pressure readings are well-controlled. - Continue amlodipine 5 mg daily - Monitor blood pressure      Relevant Orders   Comprehensive metabolic panel     Endocrine   Subclinical hypothyroidism   Subclinical hypothyroidism with variable thyroid function. Plans to recheck thyroid function tests. - Order thyroid function tests      Relevant Orders   TSH + free T4     Other   Hypercholesteremia   Hyperlipidemia managed with Zetia (ezetimibe) 10 mg daily. Previous cholesterol levels increased in September. Plans to recheck cholesterol levels and anticipates improvement with continued medication adherence. - Order lipid panel - Continue Zetia 10 mg daily      Relevant Orders   Lipid Panel With LDL/HDL Ratio   History of smoking 25-50 pack years   Relevant Orders   Ambulatory Referral Lung Cancer Screening [facility]   Class 1 obesity due to excess calories with serious comorbidity and body mass index (BMI) of 31.0 to 31.9 in adult   Discussed importance of healthy weight management Discussed diet and exercise       Prediabetes - Primary   Prediabetes with last HbA1c of 5.8%. On Zepbound 2.5 mg for six weeks with significant weight loss (16 lbs). Anticipates HbA1c normalization with continued Zepbound, which also aids in blood sugar control. - Order HbA1c test - Monitor blood sugar levels      Relevant Orders   Microalbumin / creatinine urine ratio   Hemoglobin A1c        General Health Maintenance Due for routine health maintenance screenings. History of smoking, quit  approximately 10-11 years ago, making her eligible for lung cancer screening. Discussed lung cancer screening coordination with [facility] group, emphasizing the convenience of local scan options. - Order lung cancer screening - Coordinate lung cancer screening with [facility] group - Perform physical exam in six months  Follow-up - Notify patient of lab results via MyChart - Schedule follow-up physical exam in six months.        Return in about 6 months (around 07/29/2024) for CPE.       Shirlee Latch, MD  [facility] [phone] (phone) [phone] (fax)  [facility]

## 2024-01-27 NOTE — Assessment & Plan Note (Signed)
 Discussed importance of healthy weight management Discussed diet and exercise

## 2024-01-27 NOTE — Assessment & Plan Note (Signed)
 Prediabetes with last HbA1c of 5.8%. On Zepbound 2.5 mg for six weeks with significant weight loss (16 lbs). Anticipates HbA1c normalization with continued Zepbound, which also aids in blood sugar control. - Order HbA1c test - Monitor blood sugar levels

## 2024-01-28 ENCOUNTER — Encounter: Payer: Self-pay | Admitting: Family Medicine

## 2024-01-28 LAB — LIPID PANEL WITH LDL/HDL RATIO
Cholesterol, Total: 208 mg/dL — ABNORMAL HIGH (ref 100–199)
HDL: 46 mg/dL (ref >39)
LDL Chol Calc (NIH): 129 mg/dL — ABNORMAL HIGH (ref 0–99)
LDL/HDL Ratio: 2.8 ratio (ref 0.0–3.2)
Triglycerides: 184 mg/dL — ABNORMAL HIGH (ref 0–149)
VLDL Cholesterol Cal: 33 mg/dL (ref 5–40)

## 2024-01-28 LAB — COMPREHENSIVE METABOLIC PANEL
ALT: 27 IU/L (ref 0–32)
AST: 22 IU/L (ref 0–40)
Albumin: 4.4 g/dL (ref 3.9–4.9)
Alkaline Phosphatase: 100 IU/L (ref 44–121)
BUN/Creatinine Ratio: 10 — ABNORMAL LOW (ref 12–28)
BUN: 8 mg/dL (ref 8–27)
Bilirubin Total: 0.4 mg/dL (ref 0.0–1.2)
CO2: 24 mmol/L (ref 20–29)
Calcium: 9.5 mg/dL (ref 8.7–10.3)
Chloride: 102 mmol/L (ref 96–106)
Creatinine, Ser: 0.77 mg/dL (ref 0.57–1.00)
Globulin, Total: 2.5 g/dL (ref 1.5–4.5)
Glucose: 93 mg/dL (ref 70–99)
Potassium: 3.8 mmol/L (ref 3.5–5.2)
Sodium: 141 mmol/L (ref 134–144)
Total Protein: 6.9 g/dL (ref 6.0–8.5)
eGFR: 84 mL/min/{1.73_m2} (ref >59)

## 2024-01-28 LAB — TSH+FREE T4
Free T4: 1.4 ng/dL (ref 0.82–1.77)
TSH: 2.31 u[IU]/mL (ref 0.450–4.500)

## 2024-01-28 LAB — MICROALBUMIN / CREATININE URINE RATIO
Creatinine, Urine: 108.7 mg/dL
Microalb/Creat Ratio: 37 mg/g{creat} — ABNORMAL HIGH (ref 0–29)
Microalbumin, Urine: 40.5 ug/mL

## 2024-01-28 LAB — HEMOGLOBIN A1C
Est. average glucose Bld gHb Est-mCnc: 114 mg/dL
Hgb A1c MFr Bld: 5.6 % (ref 4.8–5.6)

## 2024-01-28 LAB — SPECIMEN STATUS REPORT

## 2024-01-30 ENCOUNTER — Other Ambulatory Visit: Payer: Self-pay

## 2024-01-30 DIAGNOSIS — R778 Other specified abnormalities of plasma proteins: Secondary | ICD-10-CM

## 2024-01-31 MED ORDER — LOSARTAN POTASSIUM 25 MG PO TABS: 12.5000 mg | ORAL_TABLET | Freq: Every day | ORAL | 1 refills | Status: DC

## 2024-02-12 ENCOUNTER — Ambulatory Visit (INDEPENDENT_AMBULATORY_CARE_PROVIDER_SITE_OTHER): Admitting: Acute Care

## 2024-02-12 DIAGNOSIS — Z87891 Personal history of nicotine dependence: Secondary | ICD-10-CM

## 2024-02-12 LAB — BASIC METABOLIC PANEL
BUN/Creatinine Ratio: 13 (ref 12–28)
BUN: 10 mg/dL (ref 8–27)
CO2: 22 mmol/L (ref 20–29)
Calcium: 9.5 mg/dL (ref 8.7–10.3)
Chloride: 104 mmol/L (ref 96–106)
Creatinine, Ser: 0.77 mg/dL (ref 0.57–1.00)
Glucose: 85 mg/dL (ref 70–99)
Potassium: 4.6 mmol/L (ref 3.5–5.2)
Sodium: 142 mmol/L (ref 134–144)
eGFR: 84 mL/min/{1.73_m2} (ref >59)

## 2024-02-12 NOTE — Progress Notes (Unsigned)
 Virtual Visit via Telephone Note  I connected with Sharon Parker on 02/12/24 at 12:30 PM EDT by telephone and verified that I am speaking with the correct person using two identifiers.  Location: Patient: in home Provider: [address], [place], [place], [address]     Shared Decision Making Visit Lung Cancer Screening Program [phone])   Eligibility: Age 68 y.o. Pack Years Smoking History Calculation 42 (# packs/per year x # years smoked) Recent History of coughing up blood  no Unexplained weight loss? no ( >Than 15 pounds within the last 6 months ) Prior History Lung / other cancer no (Diagnosis within the last 5 years already requiring surveillance chest CT Scans). Smoking Status Former Smoker Former Smokers: Years since quit 12  Quit Date: 11-19-11  Visit Components: Discussion included one or more decision making aids. yes Discussion included risk/benefits of screening. yes Discussion included potential follow up diagnostic testing for abnormal scans. yes Discussion included meaning and risk of over diagnosis. yes Discussion included meaning and risk of False Positives. yes Discussion included meaning of total radiation exposure. yes  Counseling Included: Importance of adherence to annual lung cancer LDCT screening. yes Impact of comorbidities on ability to participate in the program. yes Ability and willingness to under diagnostic treatment. yes  Smoking Cessation Counseling: Current Smokers:  Discussed importance of smoking cessation. yes Information about tobacco cessation classes and interventions provided to patient. yes Patient provided with "ticket" for LDCT Scan. yes Symptomatic Patient. no  Counseling NA Diagnosis Code: Tobacco Use Z72.0 Asymptomatic Patient yes  Counseling (Intermediate counseling: > three minutes counseling) U0454 Former Smokers:  Discussed the importance of maintaining cigarette abstinence. yes Diagnosis Code: Personal  History of Nicotine Dependence. U98.119 Information about tobacco cessation classes and interventions provided to patient. Yes Patient provided with "ticket" for LDCT Scan. yes Written Order for Lung Cancer Screening with LDCT placed in Epic. Yes (CT Chest Lung Cancer Screening Low Dose W/O CM) [id number] Z12.2-Screening of respiratory organs Z87.891-Personal history of nicotine dependence   Karl Bales, RN 02/12/24

## 2024-02-12 NOTE — Patient Instructions (Signed)

## 2024-02-13 ENCOUNTER — Ambulatory Visit
Admission: RE | Admit: 2024-02-13 | Discharge: 2024-02-13 | Disposition: A | Discharge status: Home / Self Care | Source: Ambulatory Visit | Attending: Acute Care | Admitting: Acute Care

## 2024-02-13 ENCOUNTER — Encounter: Payer: Self-pay | Admitting: Family Medicine

## 2024-02-13 DIAGNOSIS — Z87891 Personal history of nicotine dependence: Secondary | ICD-10-CM

## 2024-02-13 DIAGNOSIS — Z122 Encounter for screening for malignant neoplasm of respiratory organs: Secondary | ICD-10-CM | POA: Diagnosis not present

## 2024-03-11 ENCOUNTER — Other Ambulatory Visit: Payer: Self-pay | Admitting: Family Medicine

## 2024-03-11 DIAGNOSIS — I10 Essential (primary) hypertension: Secondary | ICD-10-CM

## 2024-03-11 NOTE — Telephone Encounter (Signed)
 Requested Prescriptions  Pending Prescriptions Disp Refills   amLODipine  (NORVASC ) 5 MG tablet [Pharmacy Med Name: amLODIPine  Besylate 5 MG Oral Tablet] 100 tablet 1    Sig: TAKE 1 TABLET BY MOUTH DAILY     Cardiovascular: Calcium  Channel Blockers 2 Passed - 03/11/2024  4:29 PM      Passed - Last BP in normal range    BP Readings from Last 1 Encounters:  01/27/24 131/77         Passed - Last Heart Rate in normal range    Pulse Readings from Last 1 Encounters:  01/27/24 68         Passed - Valid encounter within last 6 months    Recent Outpatient Visits           1 month ago Prediabetes   [facility] [place], Stan Eans, MD       Future Appointments             In 4 months Bacigalupo, Stan Eans, MD [facility]

## 2024-03-19 ENCOUNTER — Telehealth: Payer: Self-pay

## 2024-03-19 ENCOUNTER — Other Ambulatory Visit: Payer: Self-pay

## 2024-03-19 DIAGNOSIS — Z122 Encounter for screening for malignant neoplasm of respiratory organs: Secondary | ICD-10-CM

## 2024-03-19 DIAGNOSIS — Z87891 Personal history of nicotine dependence: Secondary | ICD-10-CM

## 2024-03-19 NOTE — Telephone Encounter (Signed)
 Copied from CRM [id number]. Topic: Clinical - Lab/Test Results >> Mar 18, 2024  4:01 PM Zipporah Him wrote: Reason for CRM: Patient calling in for imaging results. They were done on 3/27. She has not heard back, chart shows not released. Please call with any information.

## 2024-06-17 ENCOUNTER — Telehealth: Payer: Self-pay | Admitting: Family Medicine

## 2024-06-17 ENCOUNTER — Other Ambulatory Visit: Payer: Self-pay

## 2024-06-17 MED ORDER — EZETIMIBE 10 MG PO TABS: 10.0000 mg | ORAL_TABLET | Freq: Every day | ORAL | 0 refills | Status: DC

## 2024-06-17 NOTE — Telephone Encounter (Signed)
[facility] faxed refill request for the following medications:  ezetimibe (ZETIA) 10 MG tablet   Please advise.  

## 2024-06-30 ENCOUNTER — Ambulatory Visit: Payer: Self-pay

## 2024-06-30 DIAGNOSIS — Z0000 Encounter for general adult medical examination without abnormal findings: Secondary | ICD-10-CM

## 2024-06-30 NOTE — Patient Instructions (Signed)
 Ms. Sharon Parker , Thank you for taking time out of your busy schedule to complete your Annual Wellness Visit with me. I enjoyed our conversation and look forward to speaking with you again next year. I, as well as your care team,  appreciate your ongoing commitment to your health goals. Please review the following plan we discussed and let me know if I can assist you in the future.   Follow up Visits: 07/06/25 @ 3:10 PM BY PHONE We will see or speak with you next year for your Next Medicare AWV with our clinical staff Have you seen your provider in the last 6 months (3 months if uncontrolled diabetes)? Yes  Clinician Recommendations:  Aim for 30 minutes of exercise or brisk walking, 6-8 glasses of water, and 5 servings of fruits and vegetables each day. TAKE CARE!      This is a list of the screenings recommended for you:  Health Maintenance  Topic Date Due   COVID-19 Vaccine (5 - 2024-25 season) 07/21/2023   Flu Shot  06/19/2024   Mammogram  10/20/2024   Screening for Lung Cancer  02/12/2025   Medicare Annual Wellness Visit  06/30/2025   DEXA scan (bone density measurement)  07/20/2026   Colon Cancer Screening  04/30/2028   DTaP/Tdap/Td vaccine (4 - Td or Tdap) 10/07/2031   Pneumococcal Vaccine for age over 74  Completed   Hepatitis C Screening  Completed   Zoster (Shingles) Vaccine  Completed   Hepatitis B Vaccine  Aged Out   HPV Vaccine  Aged Out   Meningitis B Vaccine  Aged Out    Advanced directives: (ACP Link)Information on Advanced Care Planning can be found at [place]  Print production planner Health Care Directives Advance Health Care Directives. [email or web address]  Advance Care Planning is important because it:  [x]  Makes sure you receive the medical care that is consistent with your values, goals, and preferences  [x]  It provides guidance to your family and loved ones and reduces their decisional burden about whether or not they are making the right decisions based on your  wishes.  Follow the link provided in your after visit summary or read over the paperwork we have mailed to you to help you started getting your Advance Directives in place. If you need assistance in completing these, please reach out to us  so that we can help you!

## 2024-06-30 NOTE — Progress Notes (Signed)
 Subjective:   Sharon Parker is a 67 y.o. who presents for a Medicare Wellness preventive visit.  As a reminder, Annual Wellness Visits don't include a physical exam, and some assessments may be limited, especially if this visit is performed virtually. We may recommend an in-person follow-up visit with your provider if needed.  Visit Complete: Virtual I connected with  Sharon Parker on 06/30/24 by a audio enabled telemedicine application and verified that I am speaking with the correct person using two identifiers.  Patient Location: Home  Provider Location: Office/Clinic  I discussed the limitations of evaluation and management by telemedicine. The patient expressed understanding and agreed to proceed.  Vital Signs: Because this visit was a virtual/telehealth visit, some criteria may be missing or patient reported. Any vitals not documented were not able to be obtained and vitals that have been documented are patient reported.  VideoDeclined- This patient declined Librarian, academic. Therefore the visit was completed with audio only.  Persons Participating in Visit: Patient.  AWV Questionnaire: No: Patient Medicare AWV questionnaire was not completed prior to this visit.  Cardiac Risk Factors include: advanced age (>70men, >9 women);dyslipidemia;hypertension;obesity (BMI >30kg/m2)     Objective:    There were no vitals filed for this visit. There is no height or weight on file to calculate BMI.     06/30/2024    1:58 PM 06/26/2023    1:52 PM 06/14/2022   11:24 AM 04/30/2018    8:00 AM 02/11/2017    9:18 AM  Advanced Directives  Does Patient Have a Medical Advance Directive? No No No No  No   Would patient like information on creating a medical advance directive? No - Patient declined  No - Patient declined No - Patient declined       Data saved with a previous flowsheet row definition    Current Medications (verified) Outpatient Encounter  Medications as of 06/30/2024  Medication Sig   amLODipine  (NORVASC ) 5 MG tablet TAKE 1 TABLET BY MOUTH DAILY   ezetimibe  (ZETIA ) 10 MG tablet Take 1 tablet (10 mg total) by mouth daily.   fluticasone  (FLONASE ) 50 MCG/ACT nasal spray Place 2 sprays into both nostrils daily. (Patient taking differently: Place 2 sprays into both nostrils daily. TAKES PRN)   losartan  (COZAAR ) 25 MG tablet Take 0.5 tablets (12.5 mg total) by mouth daily.   Multiple Vitamin (MULTIVITAMINS PO) Take by mouth.   tirzepatide (ZEPBOUND) 2.5 MG/0.5ML Pen Inject 2.5 mg into the skin once a week.   UBIQUINOL PO Take by mouth.   [DISCONTINUED] tirzepatide (ZEPBOUND) 2.5 MG/0.5ML injection vial Inject into the skin.   [DISCONTINUED] Boswellia-Glucosamine-Vit D (OSTEO BI-FLEX ONE PER DAY) TABS Take by mouth. (Patient not taking: Reported on 01/27/2024)   [DISCONTINUED] fexofenadine (ALLEGRA) 180 MG tablet Take 180 mg by mouth daily.   [DISCONTINUED] furosemide  (LASIX ) 20 MG tablet TAKE 1 TABLET BY MOUTH DAILY AS  NEEDED FOR EDEMA (Patient not taking: Reported on 01/27/2024)   [DISCONTINUED] meloxicam  (MOBIC ) 15 MG tablet Take 1 tablet (15 mg total) by mouth daily.   No facility-administered encounter medications on file as of 06/30/2024.    Allergies (verified) Patient has no known allergies.   History: Past Medical History:  Diagnosis Date   Allergy    Arthritis 2017   Hyperlipidemia    Hypertension    Past Surgical History:  Procedure Laterality Date   CESAREAN SECTION  1994   COLONOSCOPY  2009   COLONOSCOPY WITH PROPOFOL  N/A  04/30/2018   Procedure: COLONOSCOPY WITH PROPOFOL ;  Surgeon: Dessa Reyes ORN, MD;  Location: [facility] ENDOSCOPY;  Service: Endoscopy;  Laterality: N/A;   HERNIA REPAIR     NASAL SEPTUM SURGERY  1983   NECK SURGERY  2002   disc in neck replaced C5-C6   SPINE SURGERY  1999   C-5 C-7 disc neck   TONSILLECTOMY     TONSILLECTOMY AND ADENOIDECTOMY  1970   Family History  Problem Relation Age  of Onset   Stroke Mother    Heart disease Mother        CHF   Lung cancer Father    Diabetes Sister    Obesity Sister    Heart attack Brother    Healthy Sister    Healthy Sister    Healthy Sister    Healthy Brother    Social History   Socioeconomic History   Marital status: Married    Spouse name: Not on file   Number of children: Not on file   Years of education: Not on file   Highest education level: GED or equivalent  Occupational History   Not on file  Tobacco Use   Smoking status: Former    Current packs/day: 0.00    Average packs/day: 1 pack/day for 25.0 years (25.0 ttl pk-yrs)    Types: Cigarettes    Start date: 11/18/1986    Quit date: 11/19/2011    Years since quitting: 12.6   Smokeless tobacco: Never  Vaping Use   Vaping status: Never Used  Substance and Sexual Activity   Alcohol use: No   Drug use: No   Sexual activity: Not Currently    Birth control/protection: None  Other Topics Concern   Not on file  Social History Narrative   Not on file   Social Drivers of Health   Financial Resource Strain: Low Risk  (06/30/2024)   Overall Financial Resource Strain (CARDIA)    Difficulty of Paying Living Expenses: Not hard at all  Food Insecurity: No Food Insecurity (06/30/2024)   Hunger Vital Sign    Worried About Running Out of Food in the Last Year: Never true    Ran Out of Food in the Last Year: Never true  Transportation Needs: No Transportation Needs (06/30/2024)   PRAPARE - Administrator, Civil Service (Medical): No    Lack of Transportation (Non-Medical): No  Physical Activity: Sufficiently Active (06/30/2024)   Exercise Vital Sign    Days of Exercise per Week: 6 days    Minutes of Exercise per Session: 60 min  Stress: No Stress Concern Present (06/30/2024)   [employer] of Occupational Health - Occupational Stress Questionnaire    Feeling of Stress: Not at all  Social Connections: Moderately Isolated (06/30/2024)   Social  Connection and Isolation Panel    Frequency of Communication with Friends and Family: More than three times a week    Frequency of Social Gatherings with Friends and Family: Twice a week    Attends Religious Services: Patient declined    Database administrator or Organizations: No    Attends Engineer, structural: Never    Marital Status: Married    Tobacco Counseling Counseling given: Not Answered    Clinical Intake:  Pre-visit preparation completed: Yes  Pain : No/denies pain     BMI - recorded: 32.1 Nutritional Status: BMI > 30  Obese Nutritional Risks: None Diabetes: No  Lab Results  Component Value Date   HGBA1C 5.6 01/27/2024  HGBA1C 5.8 (H) 07/29/2023   HGBA1C 5.2 01/25/2023     How often do you need to have someone help you when you read instructions, pamphlets, or other written materials from your doctor or pharmacy?: 1 - Never  Interpreter Needed?: No  Information entered by :: Sharon DAS, Sharon Parker   Activities of Daily Living     06/30/2024    1:59 PM 06/29/2024   11:20 AM  In your present state of health, do you have any difficulty performing the following activities:  Hearing? 0 0  Vision? 0 0  Difficulty concentrating or making decisions? 0 0  Walking or climbing stairs? 0 0  Dressing or bathing? 0 0  Doing errands, shopping? 0 0  Preparing Food and eating ? N N  Using the Toilet? N N  In the past six months, have you accidently leaked urine? N N  Do you have problems with loss of bowel control? N N  Managing your Medications? N N  Managing your Finances? N N  Housekeeping or managing your Housekeeping? N N    Patient Care Team: Myrla Jon HERO, MD as PCP - General (Family Medicine) Darliss Rogue, MD as PCP - Cardiology ([facility]  I have updated your Care Teams any recent Medical Services you may have received from other providers in the past year.     Assessment:   This is a routine  wellness examination for Sharon Parker.  Hearing/Vision screen Hearing Screening - Comments:: NO AIDS Vision Screening - Comments:: WEARS GLASSES ALL DAY- [place]   Goals Addressed             This Visit's Progress    DIET - INCREASE WATER INTAKE         Depression Screen     06/30/2024    1:58 PM 01/27/2024    8:30 AM 06/26/2023    1:51 PM 01/25/2023    8:12 AM 10/25/2022    8:23 AM 06/14/2022   11:23 AM 04/10/2022    9:09 AM  PHQ 2/9 Scores  PHQ - 2 Score 0 0 0 0 0 0 0  PHQ- 9 Score 0   0 0 0 0    Fall Risk     06/29/2024   11:20 AM 01/27/2024    8:30 AM 06/25/2023    8:28 AM 01/25/2023    8:11 AM 10/25/2022    8:22 AM  Fall Risk   Falls in the past year? 0 0 0 0 0  Number falls in past yr: 0 0  0 0  Injury with Fall? 0 0  0 0  Risk for fall due to : No Fall Risks No Fall Risks No Fall Risks No Fall Risks No Fall Risks  Follow up Falls evaluation completed;Falls prevention discussed Falls evaluation completed Education provided;Falls prevention discussed Falls evaluation completed Falls evaluation completed      Data saved with a previous flowsheet row definition    MEDICARE RISK AT HOME:  Medicare Risk at Home Any stairs in or around the home?: Yes If so, are there any without handrails?: No Home free of loose throw rugs in walkways, pet beds, electrical cords, etc?: Yes Adequate lighting in your home to reduce risk of falls?: Yes Life alert?: No Use of a cane, walker or w/c?: No Grab bars in the bathroom?: Yes Shower chair or bench in shower?: No Elevated toilet seat or a handicapped toilet?: No  TIMED UP AND GO:  Was the test  performed?  No  Cognitive Function: 6CIT completed        06/30/2024    2:01 PM 06/26/2023    1:56 PM 06/14/2022   11:27 AM  6CIT Screen  What Year? 0 points 0 points 0 points  What month? 0 points 0 points 0 points  What time? 0 points 0 points 0 points  Count back from 20 0 points 0 points 0 points  Months in reverse 0 points 0 points 0  points  Repeat phrase 0 points 0 points 0 points  Total Score 0 points 0 points 0 points    Immunizations Immunization History  Administered Date(s) Administered   Fluad Quad(high Dose 65+) 08/22/2021, 08/13/2022   Influenza Split 08/24/2011, 09/20/2012   Influenza,inj,Quad PF,6+ Mos 09/19/2013, 09/02/2014, 09/10/2015, 08/25/2016, 08/24/2017, 08/12/2018, 08/15/2019   Influenza,trivalent, recombinat, inj, PF 07/26/2023   Influenza-Unspecified 08/24/2017, 08/12/2018, 08/17/2020   PFIZER Comirnaty(Gray Top)Covid-19 Tri-Sucrose Vaccine 01/14/2020, 02/04/2020, 10/17/2020, 08/13/2022   PNEUMOCOCCAL CONJUGATE-20 06/13/2021   Td 08/24/2011   Tdap 08/24/2011, 10/06/2021   Zoster Recombinant(Shingrix) 07/03/2021, 09/08/2021   Zoster, Live 08/24/2011    Screening Tests Health Maintenance  Topic Date Due   COVID-19 Vaccine (5 - 2024-25 season) 07/21/2023   INFLUENZA VACCINE  06/19/2024   MAMMOGRAM  10/20/2024   Lung Cancer Screening  02/12/2025   Medicare Annual Wellness (AWV)  06/30/2025   DEXA SCAN  07/20/2026   Colonoscopy  04/30/2028   DTaP/Tdap/Td (4 - Td or Tdap) 10/07/2031   Pneumococcal Vaccine: 50+ Years  Completed   Hepatitis C Screening  Completed   Zoster Vaccines- Shingrix  Completed   Hepatitis B Vaccines  Aged Out   HPV VACCINES  Aged Out   Meningococcal B Vaccine  Aged Out    Health Maintenance  Health Maintenance Due  Topic Date Due   COVID-19 Vaccine (5 - 2024-25 season) 07/21/2023   INFLUENZA VACCINE  06/19/2024   Health Maintenance Items Addressed: UP TO DATE ON MAMMOGRAM, COLONOSCOPY & BDS; UP TO DATE ON SHOTS, WANTS NO MORE COVID; UP TO DATE ON LUNG CA SCREENING  Additional Screening:  Vision Screening: Recommended annual ophthalmology exams for early detection of glaucoma and other disorders of the eye. Would you like a referral to an eye doctor? No    Dental Screening: Recommended annual dental exams for proper oral hygiene  Community Resource  Referral / Chronic Care Management: CRR required this visit?  No   CCM required this visit?  No   Plan:    I have personally reviewed and noted the following in the patient's chart:   Medical and social history Use of alcohol, tobacco or illicit drugs  Current medications and supplements including opioid prescriptions. Patient is not currently taking opioid prescriptions. Functional ability and status Nutritional status Physical activity Advanced directives List of other physicians Hospitalizations, surgeries, and ER visits in previous 12 months Vitals Screenings to include cognitive, depression, and falls Referrals and appointments  In addition, I have reviewed and discussed with patient certain preventive protocols, quality metrics, and best practice recommendations. A written personalized care plan for preventive services as well as general preventive health recommendations were provided to patient.   Sharon GORMAN Das, Sharon Parker   1[id number]/7974   After Visit Summary: (MyChart) Due to this being a telephonic visit, the after visit summary with patients personalized plan was offered to patient via MyChart   Notes: Nothing significant to report at this time.

## 2024-07-09 ENCOUNTER — Telehealth: Payer: Self-pay

## 2024-07-09 DIAGNOSIS — E7800 Pure hypercholesterolemia, unspecified: Secondary | ICD-10-CM

## 2024-07-09 DIAGNOSIS — E038 Other specified hypothyroidism: Secondary | ICD-10-CM

## 2024-07-09 DIAGNOSIS — I10 Essential (primary) hypertension: Secondary | ICD-10-CM

## 2024-07-09 DIAGNOSIS — R7303 Prediabetes: Secondary | ICD-10-CM

## 2024-07-09 NOTE — Telephone Encounter (Signed)
 Called and spoke to the pt, to inform her the requested labs has been placed. She has been informed per Dr B to try and have these labs done at least 2 days before her visit. She stated she understood and had know other questions

## 2024-07-27 DIAGNOSIS — E038 Other specified hypothyroidism: Secondary | ICD-10-CM | POA: Diagnosis not present

## 2024-07-27 DIAGNOSIS — I10 Essential (primary) hypertension: Secondary | ICD-10-CM | POA: Diagnosis not present

## 2024-07-27 DIAGNOSIS — R7303 Prediabetes: Secondary | ICD-10-CM | POA: Diagnosis not present

## 2024-07-27 DIAGNOSIS — E7800 Pure hypercholesterolemia, unspecified: Secondary | ICD-10-CM | POA: Diagnosis not present

## 2024-07-28 ENCOUNTER — Ambulatory Visit: Payer: Self-pay | Admitting: Family Medicine

## 2024-07-28 LAB — COMPREHENSIVE METABOLIC PANEL WITH GFR
ALT: 15 IU/L (ref 0–32)
AST: 16 IU/L (ref 0–40)
Albumin: 4.4 g/dL (ref 3.9–4.9)
Alkaline Phosphatase: 84 IU/L (ref 44–121)
BUN/Creatinine Ratio: 13 (ref 12–28)
BUN: 10 mg/dL (ref 8–27)
Bilirubin Total: 0.4 mg/dL (ref 0.0–1.2)
CO2: 22 mmol/L (ref 20–29)
Calcium: 9 mg/dL (ref 8.7–10.3)
Chloride: 104 mmol/L (ref 96–106)
Creatinine, Ser: 0.79 mg/dL (ref 0.57–1.00)
Globulin, Total: 2.1 g/dL (ref 1.5–4.5)
Glucose: 88 mg/dL (ref 70–99)
Potassium: 4.1 mmol/L (ref 3.5–5.2)
Sodium: 141 mmol/L (ref 134–144)
Total Protein: 6.5 g/dL (ref 6.0–8.5)
eGFR: 81 mL/min/1.73 (ref >59)

## 2024-07-28 LAB — HEMOGLOBIN A1C
Est. average glucose Bld gHb Est-mCnc: 103 mg/dL
Hgb A1c MFr Bld: 5.2 % (ref 4.8–5.6)

## 2024-07-28 LAB — LIPID PANEL
Chol/HDL Ratio: 3.9 ratio (ref 0.0–4.4)
Cholesterol, Total: 219 mg/dL — ABNORMAL HIGH (ref 100–199)
HDL: 56 mg/dL (ref >39)
LDL Chol Calc (NIH): 132 mg/dL — ABNORMAL HIGH (ref 0–99)
Triglycerides: 173 mg/dL — ABNORMAL HIGH (ref 0–149)
VLDL Cholesterol Cal: 31 mg/dL (ref 5–40)

## 2024-07-28 LAB — TSH+T4F+T3FREE
Free T4: 1.14 ng/dL (ref 0.82–1.77)
T3, Free: 2.6 pg/mL (ref 2.0–4.4)
TSH: 2.41 u[IU]/mL (ref 0.450–4.500)

## 2024-07-29 ENCOUNTER — Other Ambulatory Visit: Payer: Self-pay | Admitting: Family Medicine

## 2024-07-29 DIAGNOSIS — I10 Essential (primary) hypertension: Secondary | ICD-10-CM

## 2024-07-29 NOTE — Patient Instructions (Signed)
 Please contact [phone] to schedule your mammogram after 10/20/24. You will be asked your location preference to have procedure performed. You have two options listed below.  1) [facility] located at [address] [place], [place] [address] 2) [facility] located at [address] [place], [place] [address]  Upon results being received our office will contact you. As well as all results can be viewed through your MyChart. Please feel free to contact us  if you have any further questions or concerns.

## 2024-07-30 ENCOUNTER — Encounter: Payer: Self-pay | Admitting: Family Medicine

## 2024-07-30 ENCOUNTER — Ambulatory Visit (INDEPENDENT_AMBULATORY_CARE_PROVIDER_SITE_OTHER): Admitting: Family Medicine

## 2024-07-30 VITALS — BP 129/66 | HR 67 | Ht 65.5 in | Wt 169.4 lb

## 2024-07-30 DIAGNOSIS — Z1231 Encounter for screening mammogram for malignant neoplasm of breast: Secondary | ICD-10-CM

## 2024-07-30 DIAGNOSIS — Z23 Encounter for immunization: Secondary | ICD-10-CM | POA: Diagnosis not present

## 2024-07-30 DIAGNOSIS — I10 Essential (primary) hypertension: Secondary | ICD-10-CM

## 2024-07-30 DIAGNOSIS — J431 Panlobular emphysema: Secondary | ICD-10-CM

## 2024-07-30 DIAGNOSIS — M791 Myalgia, unspecified site: Secondary | ICD-10-CM

## 2024-07-30 DIAGNOSIS — E663 Overweight: Secondary | ICD-10-CM

## 2024-07-30 DIAGNOSIS — E7800 Pure hypercholesterolemia, unspecified: Secondary | ICD-10-CM | POA: Diagnosis not present

## 2024-07-30 DIAGNOSIS — I700 Atherosclerosis of aorta: Secondary | ICD-10-CM | POA: Diagnosis not present

## 2024-07-30 DIAGNOSIS — T466X5D Adverse effect of antihyperlipidemic and antiarteriosclerotic drugs, subsequent encounter: Secondary | ICD-10-CM | POA: Diagnosis not present

## 2024-07-30 DIAGNOSIS — Z0001 Encounter for general adult medical examination with abnormal findings: Secondary | ICD-10-CM

## 2024-07-30 DIAGNOSIS — Z0000 Encounter for general adult medical examination without abnormal findings: Secondary | ICD-10-CM

## 2024-07-30 DIAGNOSIS — E038 Other specified hypothyroidism: Secondary | ICD-10-CM

## 2024-07-30 DIAGNOSIS — D3501 Benign neoplasm of right adrenal gland: Secondary | ICD-10-CM

## 2024-07-30 MED ORDER — LOSARTAN POTASSIUM 25 MG PO TABS: 12.5000 mg | ORAL_TABLET | Freq: Every day | ORAL | 3 refills | Status: AC

## 2024-07-30 MED ORDER — EZETIMIBE 10 MG PO TABS: 10.0000 mg | ORAL_TABLET | Freq: Every day | ORAL | 3 refills | Status: AC

## 2024-07-30 MED ORDER — AMLODIPINE BESYLATE 5 MG PO TABS: 5.0000 mg | ORAL_TABLET | Freq: Every day | ORAL | 3 refills | Status: AC

## 2024-07-30 NOTE — Assessment & Plan Note (Signed)
 Blood pressure is well-controlled on current medication regimen. Losartan  is being used for both blood pressure control and kidney protection. - Continue losartan  25 mg daily - Refill losartan  prescription through [facility] mail order

## 2024-07-30 NOTE — Progress Notes (Signed)
 Complete physical exam   Patient: Sharon Parker   DOB: [birth year]   68 y.o. Female  MRN: [id number] Visit Date: 07/30/2024  Today's healthcare provider: Jon Eva, MD   Chief Complaint  Patient presents with   Annual Exam    Last completed 07/29/23, AWV completed 06/30/24 Diet -  Low fat, tries not to exceed 1400 calories a day Exercise - daily for at least one hour Feeling - well Sleeping - well Concerns - none    Subjective    Sharon Parker is a 68 y.o. female who presents today for a complete physical exam.    Discussed the use of AI scribe software for clinical note transcription with the patient, who gave verbal consent to proceed.  History of Present Illness   Sharon Parker is a 68 year old female who presents for her annual physical exam.  She has hypertension, subclinical hypothyroidism, hyperlipidemia, and obesity. Current medications include amlodipine , Zetia , losartan , and Zepbound. Cholesterol levels remain elevated despite Zetia , with LDL at 132 mg/dL. She experienced muscle pains with statins, leading to discontinuation. Blood sugar levels have normalized, no longer in the prediabetes range, attributed to dietary changes.  She is concerned about storing Zepbound during an upcoming 32-day cruise, unsure if the mini-fridge will maintain the necessary temperature. She is on the lowest dose and worries about potential weight gain if doses are missed.  A lung cancer screening in March showed good lung health but noted a right adrenal adenoma, aortic atherosclerosis, and emphysema. She has had the adrenal adenoma for years. No wheezing or shortness of breath, and no issues with exercise.  She takes losartan  due to low kidney function. She is due for a mammogram in December and is up to date on shingles and pneumonia vaccinations.        Last depression screening scores    07/30/2024    8:57 AM 06/30/2024    1:58 PM 01/27/2024    8:30 AM  PHQ 2/9  Scores  PHQ - 2 Score 0 0 0  PHQ- 9 Score 0 0    Last fall risk screening    06/29/2024   11:20 AM  Fall Risk   Falls in the past year? 0  Number falls in past yr: 0  Injury with Fall? 0  Risk for fall due to : No Fall Risks  Follow up Falls evaluation completed;Falls prevention discussed        Medications: Outpatient Medications Prior to Visit  Medication Sig   fluticasone  (FLONASE ) 50 MCG/ACT nasal spray Place 2 sprays into both nostrils daily. (Patient taking differently: Place 2 sprays into both nostrils daily. TAKES PRN)   Multiple Vitamin (MULTIVITAMINS PO) Take by mouth.   tirzepatide (ZEPBOUND) 2.5 MG/0.5ML Pen Inject 2.5 mg into the skin once a week.   UBIQUINOL PO Take by mouth.   [DISCONTINUED] amLODipine  (NORVASC ) 5 MG tablet TAKE 1 TABLET BY MOUTH DAILY   [DISCONTINUED] ezetimibe  (ZETIA ) 10 MG tablet Take 1 tablet (10 mg total) by mouth daily.   [DISCONTINUED] losartan  (COZAAR ) 25 MG tablet Take 0.5 tablets (12.5 mg total) by mouth daily.   No facility-administered medications prior to visit.    Review of Systems    Objective    BP 129/66 (BP Location: Left Arm, Patient Position: Sitting, Cuff Size: Normal)   Pulse 67   Ht 5' 5.5 (1.664 m)   Wt 169 lb 6.4 oz (76.8 kg)   SpO2 (!) 87%  BMI 27.76 kg/m    Physical Exam Vitals reviewed.  Constitutional:      General: She is not in acute distress.    Appearance: Normal appearance. She is well-developed. She is not diaphoretic.  HENT:     Head: Normocephalic and atraumatic.     Right Ear: Tympanic membrane, ear canal and external ear normal.     Left Ear: Tympanic membrane, ear canal and external ear normal.     Nose: Nose normal.     Mouth/Throat:     Mouth: Mucous membranes are moist.     Pharynx: Oropharynx is clear. No oropharyngeal exudate.  Eyes:     General: No scleral icterus.    Conjunctiva/sclera: Conjunctivae normal.     Pupils: Pupils are equal, round, and reactive to light.  Neck:      Thyroid : No thyromegaly.  Cardiovascular:     Rate and Rhythm: Normal rate and regular rhythm.     Heart sounds: Normal heart sounds. No murmur heard. Pulmonary:     Effort: Pulmonary effort is normal. No respiratory distress.     Breath sounds: Normal breath sounds. No wheezing or rales.  Abdominal:     General: There is no distension.     Palpations: Abdomen is soft.     Tenderness: There is no abdominal tenderness.  Musculoskeletal:        General: No deformity.     Cervical back: Neck supple.     Right lower leg: No edema.     Left lower leg: No edema.  Lymphadenopathy:     Cervical: No cervical adenopathy.  Skin:    General: Skin is warm and dry.     Findings: No rash.  Neurological:     Mental Status: She is alert and oriented to person, place, and time. Mental status is at baseline.     Gait: Gait normal.  Psychiatric:        Mood and Affect: Mood normal.        Behavior: Behavior normal.        Thought Content: Thought content normal.      No results found for any visits on 07/30/24.  Assessment & Plan    Routine Health Maintenance and Physical Exam  Exercise Activities and Dietary recommendations  Goals      DIET - EAT MORE FRUITS AND VEGETABLES     DIET - INCREASE WATER INTAKE        Immunization History  Administered Date(s) Administered   Fluad Quad(high Dose 65+) 08/22/2021, 08/13/2022   INFLUENZA, HIGH DOSE SEASONAL PF 07/30/2024   Influenza Split 08/24/2011, 09/20/2012   Influenza,inj,Quad PF,6+ Mos 09/19/2013, 09/02/2014, 09/10/2015, 08/25/2016, 08/24/2017, 08/12/2018, 08/15/2019   Influenza,trivalent, recombinat, inj, PF 07/26/2023   Influenza-Unspecified 08/24/2017, 08/12/2018, 08/17/2020   PFIZER Comirnaty(Gray Top)Covid-19 Tri-Sucrose Vaccine 01/14/2020, 02/04/2020, 10/17/2020, 08/13/2022   PNEUMOCOCCAL CONJUGATE-20 06/13/2021   Td 08/24/2011   Tdap 08/24/2011, 10/06/2021   Zoster Recombinant(Shingrix) 07/03/2021, 09/08/2021   Zoster,  Live 08/24/2011    Health Maintenance  Topic Date Due   COVID-19 Vaccine (5 - 2025-26 season) 07/20/2024   Mammogram  10/20/2024   Lung Cancer Screening  02/12/2025   Medicare Annual Wellness (AWV)  06/30/2025   DEXA SCAN  07/20/2026   Colonoscopy  04/30/2028   DTaP/Tdap/Td (4 - Td or Tdap) 10/07/2031   Pneumococcal Vaccine: 50+ Years  Completed   Influenza Vaccine  Completed   Hepatitis C Screening  Completed   Zoster Vaccines- Shingrix  Completed   HPV  VACCINES  Aged Out   Meningococcal B Vaccine  Aged Out    Discussed health benefits of physical activity, and encouraged her to engage in regular exercise appropriate for her age and condition.  Problem List Items Addressed This Visit       Cardiovascular and Mediastinum   Essential hypertension   Blood pressure is well-controlled on current medication regimen. Losartan  is being used for both blood pressure control and kidney protection. - Continue losartan  25 mg daily - Refill losartan  prescription through [facility] mail order      Relevant Medications   amLODipine  (NORVASC ) 5 MG tablet   ezetimibe  (ZETIA ) 10 MG tablet   losartan  (COZAAR ) 25 MG tablet   Aortic atherosclerosis (HCC)   Aortic atherosclerosis noted on CT scan. Common finding, managed by controlling cholesterol levels. - Continue monitoring cholesterol levels      Relevant Medications   amLODipine  (NORVASC ) 5 MG tablet   ezetimibe  (ZETIA ) 10 MG tablet   losartan  (COZAAR ) 25 MG tablet     Respiratory   Panlobular emphysema (HCC)   Emphysema noted on CT scan, likely related to smoking history. No current symptoms of wheezing or shortness of breath. Lung cancer screening is ongoing. - Continue annual lung cancer screening - Monitor for respiratory symptoms        Endocrine   Adrenal adenoma   Right adrenal adenoma noted on CT scan. Long-standing and benign, no intervention required. - Continue routine monitoring       Subclinical hypothyroidism    Normal on last labs        Other   Hypercholesteremia   LDL cholesterol is slightly above target at 132 mg/dL, with a goal of under 869 mg/dL. Zetia  is being used due to previous statin intolerance. Discussed alternative treatments, including injectables, but decided to continue current regimen as LDL is close to target. - Continue Zetia  10 mg daily - Refill Zetia  prescription through [facility] mail order      Relevant Medications   amLODipine  (NORVASC ) 5 MG tablet   ezetimibe  (ZETIA ) 10 MG tablet   losartan  (COZAAR ) 25 MG tablet   Overweight   Weight has decreased, and blood sugar levels have normalized. Currently on Zepbound 2.5 mg weekly. Discussed concerns about medication storage during upcoming cruise and potential weight gain if medication is interrupted. - Continue Zepbound 2.5 mg weekly - prescribed by [employer] - Consult pharmacist regarding medication storage during travel      Myalgia due to statin   Other Visit Diagnoses       Annual physical exam    -  Primary     Breast cancer screening by mammogram       Relevant Orders   MM 3D SCREENING MAMMOGRAM BILATERAL BREAST     Immunization due       Relevant Orders   Flu vaccine HIGH DOSE PF(Fluzone Trivalent) (Completed)           Prediabetes, resolved Blood sugar levels have returned to normal range, indicating resolution of prediabetes. Positive lifestyle changes noted. - Continue current lifestyle modifications      Return in about 6 months (around 01/27/2025) for chronic disease f/u.     Jon Eva, MD  [facility] [phone] (phone) [phone] (fax)  [facility]

## 2024-07-30 NOTE — Assessment & Plan Note (Signed)
 Right adrenal adenoma noted on CT scan. Long-standing and benign, no intervention required. - Continue routine monitoring

## 2024-07-30 NOTE — Assessment & Plan Note (Signed)
 Emphysema noted on CT scan, likely related to smoking history. No current symptoms of wheezing or shortness of breath. Lung cancer screening is ongoing. - Continue annual lung cancer screening - Monitor for respiratory symptoms

## 2024-07-30 NOTE — Assessment & Plan Note (Signed)
 Weight has decreased, and blood sugar levels have normalized. Currently on Zepbound 2.5 mg weekly. Discussed concerns about medication storage during upcoming cruise and potential weight gain if medication is interrupted. - Continue Zepbound 2.5 mg weekly - prescribed by [employer] - Consult pharmacist regarding medication storage during travel

## 2024-07-30 NOTE — Assessment & Plan Note (Signed)
 LDL cholesterol is slightly above target at 132 mg/dL, with a goal of under 869 mg/dL. Zetia  is being used due to previous statin intolerance. Discussed alternative treatments, including injectables, but decided to continue current regimen as LDL is close to target. - Continue Zetia  10 mg daily - Refill Zetia  prescription through [facility] mail order

## 2024-07-30 NOTE — Assessment & Plan Note (Signed)
 Aortic atherosclerosis noted on CT scan. Common finding, managed by controlling cholesterol levels. - Continue monitoring cholesterol levels

## 2024-07-30 NOTE — Assessment & Plan Note (Signed)
Normal on last labs.

## 2024-09-28 ENCOUNTER — Other Ambulatory Visit: Payer: Self-pay | Admitting: Family Medicine

## 2024-11-05 ENCOUNTER — Inpatient Hospital Stay: Admission: RE | Admit: 2024-11-05 | Discharge: 2024-11-05 | Attending: Family Medicine

## 2024-11-05 DIAGNOSIS — Z1231 Encounter for screening mammogram for malignant neoplasm of breast: Secondary | ICD-10-CM | POA: Insufficient documentation

## 2024-11-12 ENCOUNTER — Ambulatory Visit: Payer: Self-pay | Admitting: Physician Assistant

## 2025-01-28 ENCOUNTER — Ambulatory Visit: Admitting: Family Medicine

## 2025-07-06 ENCOUNTER — Ambulatory Visit
